# Patient Record
Sex: Male | Born: 1980 | Race: White | Hispanic: No | Marital: Single | Smoking: Never smoker
Health system: Southern US, Community
[De-identification: ages and names within clinical notes are randomized; demographics above are authoritative.]

## PROBLEM LIST (undated history)

## (undated) DIAGNOSIS — K219 Gastro-esophageal reflux disease without esophagitis: Secondary | ICD-10-CM

## (undated) DIAGNOSIS — B019 Varicella without complication: Secondary | ICD-10-CM

## (undated) HISTORY — DX: Varicella without complication: B01.9

## (undated) HISTORY — PX: UPPER GASTROINTESTINAL ENDOSCOPY: SHX188

## (undated) HISTORY — PX: OTHER SURGICAL HISTORY: SHX169

## (undated) HISTORY — DX: Gastro-esophageal reflux disease without esophagitis: K21.9

---

## 2012-12-12 ENCOUNTER — Ambulatory Visit (INDEPENDENT_AMBULATORY_CARE_PROVIDER_SITE_OTHER): Payer: BC Managed Care – PPO | Admitting: Family Medicine

## 2012-12-12 ENCOUNTER — Encounter: Payer: Self-pay | Admitting: Family Medicine

## 2012-12-12 VITALS — BP 118/76 | HR 75 | Temp 98.5°F | Ht 68.0 in | Wt 156.0 lb

## 2012-12-12 DIAGNOSIS — Z0000 Encounter for general adult medical examination without abnormal findings: Secondary | ICD-10-CM

## 2012-12-12 NOTE — Patient Instructions (Addendum)
Confirm date of last tetanus 

## 2012-12-12 NOTE — Progress Notes (Signed)
Subjective:    Patient ID: Roy King, male    DOB: 19-Apr-1981, 32 y.o.   MRN: [id number]  HPI Patient here to establish care. Generally very healthy. He has not seen a physician several years. He had recent screening blood work through work and this was all basically normal. This is reviewed today. He does not recall date of last tetanus but thinks this was in the past 5 years He takes no regular medications. No prior surgeries.  Social history is that he is single. He works as a Psychologist, occupational. Nonsmoker. Occasional alcohol use. Family history is significant for father with hyperlipidemia. No family history of premature CAD other than maternal uncle  Patient has had some issues of intermittent dysphagia about once or twice per month. Usually with solid foods such as meats. Sometimes takes 10 minutes or more for things to pass down. He's not any appetite or weight changes. He does not have any obvious consistent reflux-type symptoms. He does consume considerable amounts of caffeine area he has never had any odynophagia.  Past Medical History  Diagnosis Date  . Chicken pox    History reviewed. No pertinent past surgical history.  reports that he has never smoked. He does not have any smokeless tobacco history on file. He reports that  drinks alcohol. He reports that he does not use illicit drugs. family history includes Hearing loss in his mother; Heart disease in his paternal grandfather; Heart disease (age of onset: 41) in his maternal uncle; Hyperlipidemia in his father. No Known Allergies    Review of Systems  Constitutional: Negative for fever, activity change, appetite change, fatigue and unexpected weight change.  HENT: Positive for trouble swallowing. Negative for ear pain, congestion, sore throat and voice change.   Eyes: Negative for pain and visual disturbance.  Respiratory: Negative for cough, shortness of breath and wheezing.   Cardiovascular: Negative for chest pain and  palpitations.  Gastrointestinal: Negative for nausea, vomiting, abdominal pain, diarrhea, constipation, blood in stool, abdominal distention and rectal pain.  Genitourinary: Negative for dysuria, hematuria and testicular pain.  Musculoskeletal: Negative for joint swelling and arthralgias.  Skin: Negative for rash.  Neurological: Negative for dizziness, syncope and headaches.  Hematological: Negative for adenopathy.  Psychiatric/Behavioral: Negative for confusion and dysphoric mood.       Objective:   Physical Exam  Constitutional: He is oriented to person, place, and time. He appears well-developed and well-nourished. No distress.  HENT:  Head: Normocephalic and atraumatic.  Right Ear: External ear normal.  Left Ear: External ear normal.  Mouth/Throat: Oropharynx is clear and moist.  Eyes: Conjunctivae and EOM are normal. Pupils are equal, round, and reactive to light.  Neck: Normal range of motion. Neck supple. No thyromegaly present.  Cardiovascular: Normal rate, regular rhythm and normal heart sounds.   No murmur heard. Pulmonary/Chest: No respiratory distress. He has no wheezes. He has no rales.  Abdominal: Soft. Bowel sounds are normal. He exhibits no distension and no mass. There is no tenderness. There is no rebound and no guarding.  Musculoskeletal: He exhibits no edema.  Lymphadenopathy:    He has no cervical adenopathy.  Neurological: He is alert and oriented to person, place, and time. He displays normal reflexes. No cranial nerve deficit.  Skin: No rash noted.  Psychiatric: He has a normal mood and affect.          Assessment & Plan:  Complete physical. Labs reviewed and all unremarkable. Confirm date of last tetanus. GI referral given the  fact he's had some solid food dysphagia off and on for a few years as above. He does not have any red flags such as appetite or weight change.

## 2012-12-15 ENCOUNTER — Encounter: Payer: Self-pay | Admitting: Internal Medicine

## 2013-01-06 ENCOUNTER — Encounter: Payer: Self-pay | Admitting: Internal Medicine

## 2013-01-09 ENCOUNTER — Ambulatory Visit: Payer: BC Managed Care – PPO | Admitting: Internal Medicine

## 2013-02-02 ENCOUNTER — Encounter: Payer: Self-pay | Admitting: Internal Medicine

## 2013-02-05 ENCOUNTER — Encounter: Payer: Self-pay | Admitting: Internal Medicine

## 2013-02-05 ENCOUNTER — Ambulatory Visit (INDEPENDENT_AMBULATORY_CARE_PROVIDER_SITE_OTHER): Payer: BC Managed Care – PPO | Admitting: Internal Medicine

## 2013-02-05 VITALS — BP 98/64 | HR 64 | Ht 68.5 in | Wt 151.0 lb

## 2013-02-05 DIAGNOSIS — R1310 Dysphagia, unspecified: Secondary | ICD-10-CM

## 2013-02-05 DIAGNOSIS — R1314 Dysphagia, pharyngoesophageal phase: Secondary | ICD-10-CM

## 2013-02-05 DIAGNOSIS — R1319 Other dysphagia: Secondary | ICD-10-CM

## 2013-02-05 NOTE — Progress Notes (Signed)
Patient ID: Roy King, male   DOB: [birth year], 32 y.o.   MRN: [id number] HPI: Roy King is a 32 year old male without significant past medical history who is seen n consultation at the request of Dr. Caryl Never for evaluation of intermittent dysphagia. He is here alone today. He reports over the last 6 years having issues with swallowing. He reports pills always been hard for him to swallow. Otherwise he reports very intermittent solid food dysphagia. He reports he has learned to chew his food extremely well. Approximately once per month in random fashion he reports having a solid food bolus become stuck in his mid chest. This usually requires him to vomit the food bolus back up. During other times he has no trouble with solid foods and he reports liquids or never a problem. He thinks his swallowing issues worsen with stress. He does report occasional heartburn reports this is "not terrible". He reports heartburn maybe once per week. No nausea or vomiting. Appetite. No weight loss. He reports regular bowel movements with no blood in his stool or melena.  He does report occasionally bilateral lower abdominal cramping. He associates this with gas, and often is present when he's had to retain gas in settings not socially acceptable to release it.    No family hx of trouble swallowing, GI tract malignancy or IBD  Past Medical History  Diagnosis Date  . Chicken pox     History reviewed. No pertinent past surgical history.  No current outpatient prescriptions on file.   No current facility-administered medications for this visit.    No Known Allergies  Family History  Problem Relation Age of Onset  . Hearing loss Mother   . Hyperlipidemia Father   . Heart disease Paternal Grandfather   . Heart disease Paternal Uncle 12    mi  . Breast cancer Maternal Grandmother   . Breast cancer Maternal Aunt     History  Substance Use Topics  . Smoking status: Never Smoker   . Smokeless tobacco: Never Used   . Alcohol Use: Yes     Comment: .25 drinks per day; mostly on weekends    ROS: As per history of present illness, otherwise negative  BP 98/64  Pulse 64  Ht 5' 8.5" (1.74 m)  Wt 151 lb (68.493 kg)  BMI 22.62 kg/m2 Constitutional: Well-developed and well-nourished. No distress. HEENT: Normocephalic and atraumatic. Oropharynx is clear and moist. No oropharyngeal exudate. Conjunctivae are normal.  No scleral icterus. Neck: Neck supple. Trachea midline. Cardiovascular: Normal rate, regular rhythm and intact distal pulses. No M/R/G Pulmonary/chest: Effort normal and breath sounds normal. No wheezing, rales or rhonchi. Abdominal: Soft, nontender, nondistended. Bowel sounds active throughout. There are no masses palpable. No hepatosplenomegaly. Extremities: no clubbing, cyanosis, or edema Lymphadenopathy: No cervical adenopathy noted. Neurological: Alert and oriented to person place and time. Skin: Skin is warm and dry. No rashes noted. Psychiatric: Normal mood and affect. Behavior is normal.  ASSESSMENT/PLAN: 32 year old male without significant past medical history who is seen n consultation at the request of Dr. Caryl Never for evaluation of intermittent dysphagia.  1.  Esophageal dysphagia -- we discussed the differential which includes esophageal ring/stricture, eosinophilic esophagitis, or esophageal dysmotility. Reflux-induced esophagitis is felt unlikely given the randomness to his symptoms and lack of daily heartburn. We discussed direct visualization with endoscopy, but at this time he is not impacted enough by the symptoms to pursue an aggressive approach. We did discuss how endoscopy would most likely allow for diagnosis either by esophageal biopsy  or direct visualization of the stricture. A stricture is found dilation could be performed. After thorough discussion, we decided on pursuing barium swallow with tablet. This will rule out stricture and also help evaluate motility. Further  recommendations after barium swallow  2.  Heartburn -- his heartburn is infrequent I have recommended over-the-counter Zantac or Pepcid per box instructions as necessary.

## 2013-02-05 NOTE — Patient Instructions (Signed)
You have been scheduled for a Barium esophagram at [facility] (1st floor of the hospital) on 02/06/2013 at 9:30. Please arrive at 9:15 for registration. Make certain not to have anything to eat or drink 4 hours prior to the test. If you need to reschedule for any reason, please contact radiology @ [phone] to do so.   Please call us if your heartburn worsens.                                               We are excited to introduce MyChart, a new best-in-class service that provides you online access to important information in your electronic medical record. We want to make it easier for you to view your health information - all in one secure location - when and where you need it. We expect MyChart will enhance the quality of care and service we provide.  When you register for MyChart, you can:    View your test results.    Request appointments and receive appointment reminders via email.    Request medication renewals.    View your medical history, allergies, medications and immunizations.    Communicate with your physician's office through a password-protected site.    Conveniently print information such as your medication lists.  To find out if MyChart is right for you, please talk to a member of our clinical staff today. We will gladly answer your questions about this free health and wellness tool.  If you are age 31 or older and want a member of your family to have access to your record, you must provide written consent by completing a proxy form available at our office. Please speak to our clinical staff about guidelines regarding accounts for patients younger than age 24.  As you activate your MyChart account and need any technical assistance, please call the MyChart technical support line at [phone]) or email your question to [email or web address]. If you email your question(s), please include your name, a return phone number and the best  time to reach you.  If you have non-urgent health-related questions, you can send a message to our office through MyChart at [email or web address]. If you have a medical emergency, call 911.  Thank you for using MyChart as your new health and wellness resource!   MyChart licensed from [employer],  [id number]. Patents Pending.

## 2013-02-06 ENCOUNTER — Other Ambulatory Visit (HOSPITAL_COMMUNITY): Payer: BC Managed Care – PPO

## 2013-02-06 ENCOUNTER — Ambulatory Visit (HOSPITAL_COMMUNITY)
Admission: RE | Admit: 2013-02-06 | Discharge: 2013-02-06 | Disposition: A | Discharge status: Home / Self Care | Payer: BC Managed Care – PPO | Source: Ambulatory Visit | Attending: Internal Medicine | Admitting: Internal Medicine

## 2013-02-06 DIAGNOSIS — R1310 Dysphagia, unspecified: Secondary | ICD-10-CM | POA: Insufficient documentation

## 2013-03-02 ENCOUNTER — Other Ambulatory Visit: Payer: Self-pay | Admitting: Family

## 2013-03-02 ENCOUNTER — Ambulatory Visit (INDEPENDENT_AMBULATORY_CARE_PROVIDER_SITE_OTHER)
Admission: RE | Admit: 2013-03-02 | Discharge: 2013-03-02 | Disposition: A | Discharge status: Home / Self Care | Payer: BC Managed Care – PPO | Source: Ambulatory Visit | Attending: Family | Admitting: Family

## 2013-03-02 ENCOUNTER — Ambulatory Visit (INDEPENDENT_AMBULATORY_CARE_PROVIDER_SITE_OTHER): Payer: BC Managed Care – PPO | Admitting: Family

## 2013-03-02 ENCOUNTER — Encounter: Payer: Self-pay | Admitting: Family

## 2013-03-02 VITALS — BP 120/80 | Temp 98.1°F | Wt 151.0 lb

## 2013-03-02 DIAGNOSIS — M546 Pain in thoracic spine: Secondary | ICD-10-CM

## 2013-03-02 DIAGNOSIS — R1012 Left upper quadrant pain: Secondary | ICD-10-CM

## 2013-03-02 LAB — HEPATIC FUNCTION PANEL
ALT: 10 U/L (ref 0–53)
AST: 17 U/L (ref 0–37)
Albumin: 4.4 g/dL (ref 3.5–5.2)
Total Protein: 7 g/dL (ref 6.0–8.3)

## 2013-03-02 LAB — CBC WITH DIFFERENTIAL/PLATELET
Basophils Relative: 0.6 % (ref 0.0–3.0)
Eosinophils Absolute: 0.1 10*3/uL (ref 0.0–0.7)
Eosinophils Relative: 2.7 % (ref 0.0–5.0)
HCT: 41.3 % (ref 39.0–52.0)
Lymphs Abs: 1.3 10*3/uL (ref 0.7–4.0)
MCHC: 34.6 g/dL (ref 30.0–36.0)
MCV: 89.6 fl (ref 78.0–100.0)
Monocytes Absolute: 0.4 10*3/uL (ref 0.1–1.0)
RBC: 4.61 Mil/uL (ref 4.22–5.81)
WBC: 4.7 10*3/uL (ref 4.5–10.5)

## 2013-03-02 LAB — BASIC METABOLIC PANEL
BUN: 13 mg/dL (ref 6–23)
CO2: 30 mEq/L (ref 19–32)
Creatinine, Ser: 1 mg/dL (ref 0.4–1.5)
GFR: 92.55 mL/min (ref >60.00)
Glucose, Bld: 93 mg/dL (ref 70–99)
Potassium: 4 mEq/L (ref 3.5–5.1)
Sodium: 138 mEq/L (ref 135–145)

## 2013-03-02 MED ORDER — KETOROLAC TROMETHAMINE 60 MG/2ML IM SOLN
60.0000 mg | Freq: Once | INTRAMUSCULAR | Status: AC
  Administered 2013-03-02: 60 mg via INTRAMUSCULAR

## 2013-03-02 MED ORDER — HYDROCODONE-ACETAMINOPHEN 10-325 MG PO TABS: 1.0000 | ORAL_TABLET | Freq: Three times a day (TID) | ORAL | Status: DC | PRN

## 2013-03-02 MED ORDER — CYCLOBENZAPRINE HCL 10 MG PO TABS: 10.0000 mg | ORAL_TABLET | Freq: Three times a day (TID) | ORAL | Status: DC | PRN

## 2013-03-02 MED ORDER — OMEPRAZOLE 40 MG PO CPDR: 40.0000 mg | DELAYED_RELEASE_CAPSULE | Freq: Every day | ORAL | Status: DC

## 2013-03-02 MED ORDER — IOHEXOL 300 MG/ML  SOLN
100.0000 mL | Freq: Once | INTRAMUSCULAR | Status: AC | PRN
  Administered 2013-03-02: 100 mL via INTRAVENOUS

## 2013-03-02 NOTE — Progress Notes (Signed)
  Subjective:    Patient ID: Roy King, male    DOB: 1980-09-14, 32 y.o.   MRN: [id number]  HPI  32 year old white male, nonsmoker, patient of Dr. Caryl King is in today with an abrupt onset of back pain and left upper quadrant pain that began suddenly yesterday and has worsened over the last 24 hours. The pain is worse with movement. Better with sitting. Describes it as a stabbing pain in the left upper quadrant to the thoracic area of the back. Denies any heavy lifting, bending, twisting or turning. Denies any diarrhea or constipation. Reports consuming tube beers over the weekend. No heavy drinking. No excessive belching or burping. Pain is 8/10.   Review of Systems  Constitutional: Negative.   HENT: Negative.   Respiratory: Negative.   Cardiovascular: Negative.   Gastrointestinal: Positive for abdominal pain. Negative for nausea, diarrhea and constipation.  Genitourinary: Negative.   Musculoskeletal: Positive for back pain.  Skin: Negative.   Neurological: Negative.   Psychiatric/Behavioral: Negative.    Past Medical History  Diagnosis Date  . Chicken pox     History   Social History  . Marital Status: Single    Spouse Name: N/A    Number of Children: N/A  . Years of Education: N/A   Occupational History  . Not on file.   Social History Main Topics  . Smoking status: King Smoker   . Smokeless tobacco: King Used  . Alcohol Use: Yes     Comment: .25 drinks per day; mostly on weekends  . Drug Use: No  . Sexual Activity: Not on file   Other Topics Concern  . Not on file   Social History Narrative  . No narrative on file    History reviewed. No pertinent past surgical history.  Family History  Problem Relation Age of Onset  . Hearing loss Mother   . Hyperlipidemia Father   . Heart disease Paternal Grandfather   . Heart disease Paternal Uncle 75    mi  . Breast cancer Maternal Grandmother   . Breast cancer Maternal Aunt     No Known Allergies  No  current outpatient prescriptions on file prior to visit.   No current facility-administered medications on file prior to visit.    BP 120/80  Temp(Src) 98.1 F (36.7 C) (Oral)  Wt 151 lb (68.493 kg)chart    Objective:   Physical Exam  Constitutional: He is oriented to person, place, and time. He appears well-developed and well-nourished.  tearful  HENT:  Right Ear: External ear normal.  Left Ear: External ear normal.  Nose: Nose normal.  Mouth/Throat: Oropharynx is clear and moist.  Neck: Normal range of motion. Neck supple. No thyromegaly present.  Cardiovascular: Normal rate, regular rhythm and normal heart sounds.   Pulmonary/Chest: Effort normal and breath sounds normal.  Abdominal: Soft. Bowel sounds are normal. There is tenderness. There is rebound and guarding.  To the left upper quadrant  Musculoskeletal: Normal range of motion.  Neurological: He is alert and oriented to person, place, and time.  Skin: Skin is warm and dry.  Psychiatric: He has a normal mood and affect.          Assessment & Plan:  Assessment: 1. Left upper quadrant pain 2. Back pain  Plan: CT scan of the abdomen ordered to rule out pancreatitis. Amylase, lipase, CBC and BMP also sent. Will notify patient of the results and discuss further treatment plan.

## 2013-03-02 NOTE — Progress Notes (Signed)
Pre visit review using our clinic review tool, if applicable. No additional management support is needed unless otherwise documented below in the visit note. 

## 2013-03-02 NOTE — Patient Instructions (Addendum)
Abdominal Pain °Many things can cause belly (abdominal) pain. Most times, the belly pain is not dangerous. The amount of belly pain does not tell how serious the problem may be. Many cases of belly pain can be watched and treated at home. °HOME CARE  °· Do not take medicines that help you go poop (laxatives) unless told to by your doctor. °· Only take medicine as told by your doctor. °· Eat or drink as told by your doctor. Your doctor will tell you if you should be on a special diet. °GET HELP RIGHT AWAY IF:  °· The pain does not go away. °· You have a fever. °· You keep throwing up (vomiting). °· The pain changes and is only in the right or left part of the belly. °· You have bloody or tarry looking poop. °MAKE SURE YOU:  °· Understand these instructions. °· Will watch your condition. °· Will get help right away if you are not doing well or get worse. °Document Released: 09/26/2007 Document Revised: 07/02/2011 Document Reviewed: 04/25/2009 °ExitCare® Patient Information ©2014 [employer]. ° °

## 2013-03-04 ENCOUNTER — Telehealth: Payer: Self-pay | Admitting: Family Medicine

## 2013-03-04 NOTE — Telephone Encounter (Signed)
Patient calling into triage line regarding gastritis. Attempted to return call. No answer. Voicemail reached advised to call back for assistance. No triage. No contact.  Landers, Prajapati [phone]

## 2013-03-05 ENCOUNTER — Telehealth: Payer: Self-pay | Admitting: Family Medicine

## 2013-03-05 NOTE — Telephone Encounter (Signed)
[facility] Triage Call Report Triage Record Num: [id number] Operator: Roy King Patient Name: Roy King Call Date & Time: 03/04/2013 5:09:54PM Patient Phone: [phone] PCP: Evelena Peat Patient Gender: Male PCP Fax : [phone] Patient DOB: 1980/10/02 [facility] Name[facility] Reason for Call: Caller: [name]; PCP: Evelena Peat (Family Practice); CB#: [phone]; Call regarding ?? about Gastritis; Caller was seen in the office on Monday 03/02/13, diagnosed with Gastritis and has some questions. He reports he is improving, but still has some ongoing back pain. Caller given information from CAN Information Files. Causes of and home care for Gastritis given per Information Files. Caller asked what he was given by injection when he was seen in the office, EPIC record accessed. Advised he was given Toradol. All questions answered, caller advised to callback as needed and is agreeable. Protocol(s) Used: Information Only Call; No Symptom Triage (Adult) Recommended Outcome per Protocol: Provide Information or Advice Only Reason for Outcome: Health information question; person denies any symptoms, no triage required. Information provided from approved references or clinical experience. Care Advice: ~ 11/

## 2013-03-06 ENCOUNTER — Encounter: Payer: Self-pay | Admitting: Family

## 2013-03-06 ENCOUNTER — Telehealth: Payer: Self-pay | Admitting: Family Medicine

## 2013-03-06 ENCOUNTER — Ambulatory Visit (INDEPENDENT_AMBULATORY_CARE_PROVIDER_SITE_OTHER): Payer: BC Managed Care – PPO | Admitting: Family

## 2013-03-06 VITALS — BP 110/78 | HR 63 | Wt 151.0 lb

## 2013-03-06 DIAGNOSIS — IMO0001 Reserved for inherently not codable concepts without codable children: Secondary | ICD-10-CM

## 2013-03-06 DIAGNOSIS — K219 Gastro-esophageal reflux disease without esophagitis: Secondary | ICD-10-CM

## 2013-03-06 LAB — CBC WITH DIFFERENTIAL/PLATELET
Eosinophils Relative: 2.1 % (ref 0.0–5.0)
HCT: 41.5 % (ref 39.0–52.0)
Monocytes Relative: 7.1 % (ref 3.0–12.0)
Neutrophils Relative %: 58.6 % (ref 43.0–77.0)
Platelets: 168 10*3/uL (ref 150.0–400.0)
WBC: 5.1 10*3/uL (ref 4.5–10.5)

## 2013-03-06 LAB — BASIC METABOLIC PANEL
BUN: 10 mg/dL (ref 6–23)
CO2: 32 mEq/L (ref 19–32)
Calcium: 9.3 mg/dL (ref 8.4–10.5)
Chloride: 99 mEq/L (ref 96–112)
Creatinine, Ser: 0.9 mg/dL (ref 0.4–1.5)
Glucose, Bld: 67 mg/dL — ABNORMAL LOW (ref 70–99)

## 2013-03-06 MED ORDER — CYCLOBENZAPRINE HCL 10 MG PO TABS: 10.0000 mg | ORAL_TABLET | Freq: Three times a day (TID) | ORAL | Status: DC | PRN

## 2013-03-06 NOTE — Telephone Encounter (Signed)
Patient Information:  Caller Name: Elder  Phone: [phone]  Patient: [place], Bartosiewicz  Gender: Male  DOB: Jun 25, 1980  Age: 32 Years  PCP: Evelena Peat [facility])  Office Follow Up:  Does the office need to follow up with this patient?: No  Instructions For The Office: N/A   Symptoms  Reason For Call & Symptoms: Pt was in 03/02/13 with low back pain. He had a CT scan which r/o any pancreatitis/appendicitis. Pt was dx with gastritis. 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 Pt was given Vicodin and Prilosec. Pt is calling back to say the pain has extended into his hips and knees and is constant and 5/10.  Reviewed Health History In EMR: Yes  Reviewed Medications In EMR: Yes  Reviewed Allergies In EMR: Yes  Reviewed Surgeries / Procedures: Yes  Date of Onset of Symptoms: 03/02/2013  Guideline(s) Used:  Leg Pain  Back Pain  Disposition Per Guideline:   See Today in Office  Reason For Disposition Reached:   Pain radiates into the thigh or further down the leg, and in both legs  Advice Given:  N/A  Patient Will Follow Care Advice:  YES  Appointment Scheduled:  03/06/2013 13:15:00 Appointment Scheduled Provider:  Evelena Peat (Family Practice)

## 2013-03-06 NOTE — Progress Notes (Signed)
  Subjective:    Patient ID: Roy King, male    DOB: Feb 09, 1981, 32 y.o.   MRN: [id number]  HPI 32 year old white male, patient of Dr. Caryl Never is in today with complaints of low back pain and knee pain ongoing x4 days. He rates the pain a 4-5/10. Denies any injury. Describes it as achy.   and worse with movement. Patient was seen earlier this week for gastritis and taken omeprazole. His symptoms have improved significantly. Reports having increased stress in his life with new proposal to his girlfriend, planning a wedding, recently being in a wedding, and work stress. Has been taking Vicodin and that was given for abdominal pain that has helped..  Review of Systems  Constitutional: Negative.   Respiratory: Negative.   Cardiovascular: Negative.   Endocrine: Negative.   Genitourinary: Negative.   Musculoskeletal: Positive for arthralgias and back pain.       Knee pain   Skin: Negative.   Neurological: Negative.   Psychiatric/Behavioral: Negative.    Past Medical History  Diagnosis Date  . Chicken pox     History   Social History  . Marital Status: Single    Spouse Name: N/A    Number of Children: N/A  . Years of Education: N/A   Occupational History  . Not on file.   Social History Main Topics  . Smoking status: Never Smoker   . Smokeless tobacco: Never Used  . Alcohol Use: Yes     Comment: .25 drinks per day; mostly on weekends  . Drug Use: No  . Sexual Activity: Not on file   Other Topics Concern  . Not on file   Social History Narrative  . No narrative on file    History reviewed. No pertinent past surgical history.  Family History  Problem Relation Age of Onset  . Hearing loss Mother   . Hyperlipidemia Father   . Heart disease Paternal Grandfather   . Heart disease Paternal Uncle 25    mi  . Breast cancer Maternal Grandmother   . Breast cancer Maternal Aunt     No Known Allergies  Current Outpatient Prescriptions on File Prior to Visit   Medication Sig Dispense Refill  . omeprazole (PRILOSEC) 40 MG capsule Take 1 capsule (40 mg total) by mouth daily.  30 capsule  3   No current facility-administered medications on file prior to visit.    BP 110/78  Pulse 63  Wt 151 lb (68.493 kg)chart    Objective:   Physical Exam  Constitutional: He is oriented to person, place, and time. He appears well-developed and well-nourished.  Neck: Normal range of motion. Neck supple.  Cardiovascular: Normal rate, regular rhythm and normal heart sounds.   Pulmonary/Chest: Breath sounds normal.  Abdominal: Soft. Bowel sounds are normal.  Musculoskeletal: Normal range of motion. He exhibits no edema.  Negative SLR.   Neurological: He is alert and oriented to person, place, and time.  Skin: Skin is warm and dry.          Assessment & Plan:  Assessment: 1. Joint pain 2. Low back pain 3. GERD  Plan: Continue omeprazole. Flexeril 10 mg 3 times a day as needed for aches and pains. CBC, ESR, and BMP sent. Increase fluid intake, particularly water. Patient to call the office with any questions or concerns. Recheck pending labs and sooner if needed.

## 2013-03-06 NOTE — Patient Instructions (Signed)

## 2013-03-06 NOTE — Progress Notes (Signed)
Pre visit review using our clinic review tool, if applicable. No additional management support is needed unless otherwise documented below in the visit note. 

## 2013-07-23 ENCOUNTER — Other Ambulatory Visit: Payer: Self-pay | Admitting: Family

## 2013-12-12 ENCOUNTER — Other Ambulatory Visit: Payer: Self-pay | Admitting: Family Medicine

## 2013-12-18 ENCOUNTER — Telehealth: Payer: Self-pay | Admitting: Family Medicine

## 2013-12-18 ENCOUNTER — Encounter: Payer: Self-pay | Admitting: Family Medicine

## 2013-12-18 ENCOUNTER — Ambulatory Visit (INDEPENDENT_AMBULATORY_CARE_PROVIDER_SITE_OTHER): Payer: BC Managed Care – PPO | Admitting: Family Medicine

## 2013-12-18 VITALS — BP 126/78 | HR 66 | Temp 97.6°F | Wt 151.0 lb

## 2013-12-18 DIAGNOSIS — R109 Unspecified abdominal pain: Secondary | ICD-10-CM

## 2013-12-18 DIAGNOSIS — R112 Nausea with vomiting, unspecified: Secondary | ICD-10-CM

## 2013-12-18 MED ORDER — ONDANSETRON 8 MG PO TBDP: 8.0000 mg | ORAL_TABLET | Freq: Three times a day (TID) | ORAL | Status: DC | PRN

## 2013-12-18 NOTE — Progress Notes (Signed)
Pre visit review using our clinic review tool, if applicable. No additional management support is needed unless otherwise documented below in the visit note. 

## 2013-12-18 NOTE — Telephone Encounter (Signed)
Patient Information:  Caller Name: Waunita Schooner  Phone: [phone]  Patient: Roy King, Roy King  Gender: Male  DOB: October 23, 1980  Age: 33 Years  PCP: Other  Office Follow Up:  Does the office need to follow up with this patient?: No  Instructions For The Office: N/A   Symptoms  Reason For Call & Symptoms: Patient reports generalized abdominal pain that started within 15 minutes after he ate oysters on 12/17/13 (estimated 21:00).  Pain is constant, rated at 3-4 of 10, up to 6-7 with movement.  History of gastritis and he suspects same. Emergent symptoms ruled out.  Go to ED Now or to Office with PCP Approval.  Reviewed Health History In EMR: Yes  Reviewed Medications In EMR: Yes  Reviewed Allergies In EMR: Yes  Reviewed Surgeries / Procedures: Yes  Date of Onset of Symptoms: 12/17/2013  Treatments Tried: Tums, Prilosec  Treatments Tried Worked: No  Guideline(s) Used:  Abdominal Pain - Male  Disposition Per Guideline:   Go to ED Now (or to Office with PCP Approval)  Reason For Disposition Reached:   Constant abdominal pain lasting > 2 hours  Advice Given:  Rest:  Lie down and rest until you feel better.  Fluids:  Sip clear fluids only (e.g., water, flat soft drinks or half-strength fruit juice) until the pain has been gone for over 2 hours. Then slowly return to a regular diet.  Diet:  Slowly advance diet from clear liquids to a bland diet  Avoid alcohol or caffeinated beverages  Avoid greasy or fatty foods  Call Back If:  You become worse.  RN Overrode Recommendation:  Make Appointment  History of ingesting raw oysters 12/17/13; history of gastritis.  ash/can.  Appointment Scheduled:  12/18/2013 09:30:00 Appointment Scheduled Provider:  Carolann Littler [facility])

## 2013-12-18 NOTE — Patient Instructions (Signed)
Follow up for any fever, recurrent vomiting, or recurrent abdominal pain

## 2013-12-18 NOTE — Progress Notes (Signed)
   Subjective:    Patient ID: Roy King, male    DOB: 10/28/1980, 33 y.o.   MRN: [id number]  Abdominal Pain Associated symptoms include nausea and vomiting. Pertinent negatives include no diarrhea or fever.   Patient seen as a work in. Last night he 8 [employer] oysters and had a couple of beers. During the night he developed some diffuse crampy pain but mostly nausea with one episode of vomiting. No diarrhea. No fever. He feels much better today though still slightly queasy. He does not have any localizing abdominal pain. He took sometimes with minimal relief. He has history of reflux and takes Prilosec for that.  Past Medical History  Diagnosis Date  . Chicken pox    No past surgical history on file.  reports that he has never smoked. He has never used smokeless tobacco. He reports that he drinks alcohol. He reports that he does not use illicit drugs. family history includes Breast cancer in his maternal aunt and maternal grandmother; Hearing loss in his mother; Heart disease in his paternal grandfather; Heart disease (age of onset: 35) in his paternal uncle; Hyperlipidemia in his father. No Known Allergies    Review of Systems  Constitutional: Negative for fever and chills.  Cardiovascular: Negative for chest pain.  Gastrointestinal: Positive for nausea, vomiting and abdominal pain. Negative for diarrhea and blood in stool.       Objective:   Physical Exam  Constitutional: He appears well-developed and well-nourished.  Cardiovascular: Normal rate and regular rhythm.   Pulmonary/Chest: Effort normal and breath sounds normal. No respiratory distress. He has no wheezes. He has no rales.  Abdominal: Soft. Bowel sounds are normal. He exhibits no distension and no mass. There is no tenderness. There is no rebound and no guarding.          Assessment & Plan:  Nausea, vomiting, and abdominal cramping following ingestion of raw oysters. He does not have evidence for infectious issues  at this time. Zofran 8 mg every 8 hours as needed for nausea and vomiting. Followup promptly for any fever, recurrent vomiting, or recurrent abdominal pain.

## 2014-01-14 ENCOUNTER — Other Ambulatory Visit: Payer: Self-pay | Admitting: Family Medicine

## 2014-08-08 ENCOUNTER — Other Ambulatory Visit: Payer: Self-pay | Admitting: Family Medicine

## 2015-02-08 ENCOUNTER — Other Ambulatory Visit: Payer: Self-pay | Admitting: Family Medicine

## 2015-03-28 ENCOUNTER — Other Ambulatory Visit (INDEPENDENT_AMBULATORY_CARE_PROVIDER_SITE_OTHER): Payer: BLUE CROSS/BLUE SHIELD

## 2015-03-28 DIAGNOSIS — Z0000 Encounter for general adult medical examination without abnormal findings: Secondary | ICD-10-CM | POA: Diagnosis not present

## 2015-03-28 LAB — CBC WITH DIFFERENTIAL/PLATELET
BASOS PCT: 0.8 % (ref 0.0–3.0)
Basophils Absolute: 0 10*3/uL (ref 0.0–0.1)
EOS PCT: 6.8 % — AB (ref 0.0–5.0)
Eosinophils Absolute: 0.4 10*3/uL (ref 0.0–0.7)
HEMATOCRIT: 42.6 % (ref 39.0–52.0)
HEMOGLOBIN: 14.4 g/dL (ref 13.0–17.0)
LYMPHS PCT: 30.5 % (ref 12.0–46.0)
Lymphs Abs: 1.7 10*3/uL (ref 0.7–4.0)
MCHC: 33.8 g/dL (ref 30.0–36.0)
MCV: 89.2 fl (ref 78.0–100.0)
MONOS PCT: 9.4 % (ref 3.0–12.0)
Monocytes Absolute: 0.5 10*3/uL (ref 0.1–1.0)
Neutro Abs: 2.9 10*3/uL (ref 1.4–7.7)
Neutrophils Relative %: 52.5 % (ref 43.0–77.0)
Platelets: 157 10*3/uL (ref 150.0–400.0)
RBC: 4.77 Mil/uL (ref 4.22–5.81)
RDW: 12.7 % (ref 11.5–15.5)
WBC: 5.4 10*3/uL (ref 4.0–10.5)

## 2015-03-28 LAB — BASIC METABOLIC PANEL
BUN: 14 mg/dL (ref 6–23)
CHLORIDE: 101 meq/L (ref 96–112)
CO2: 30 meq/L (ref 19–32)
Calcium: 9.4 mg/dL (ref 8.4–10.5)
Creatinine, Ser: 0.87 mg/dL (ref 0.40–1.50)
GFR: 107.27 mL/min (ref >60.00)
Glucose, Bld: 81 mg/dL (ref 70–99)
POTASSIUM: 4 meq/L (ref 3.5–5.1)
Sodium: 139 mEq/L (ref 135–145)

## 2015-03-28 LAB — TSH: TSH: 2.72 u[IU]/mL (ref 0.35–4.50)

## 2015-03-28 LAB — HEPATIC FUNCTION PANEL
ALBUMIN: 4.6 g/dL (ref 3.5–5.2)
ALK PHOS: 49 U/L (ref 39–117)
ALT: 10 U/L (ref 0–53)
AST: 14 U/L (ref 0–37)
Bilirubin, Direct: 0.1 mg/dL (ref 0.0–0.3)
TOTAL PROTEIN: 7 g/dL (ref 6.0–8.3)
Total Bilirubin: 0.5 mg/dL (ref 0.2–1.2)

## 2015-03-28 LAB — LIPID PANEL
CHOL/HDL RATIO: 4
Cholesterol: 173 mg/dL (ref 0–200)
HDL: 49.4 mg/dL (ref >39.00)
LDL Cholesterol: 93 mg/dL (ref 0–99)
NONHDL: 123.97
Triglycerides: 153 mg/dL — ABNORMAL HIGH (ref 0.0–149.0)
VLDL: 30.6 mg/dL (ref 0.0–40.0)

## 2015-04-05 ENCOUNTER — Ambulatory Visit (INDEPENDENT_AMBULATORY_CARE_PROVIDER_SITE_OTHER): Payer: BLUE CROSS/BLUE SHIELD | Admitting: Family Medicine

## 2015-04-05 ENCOUNTER — Encounter: Payer: Self-pay | Admitting: Family Medicine

## 2015-04-05 VITALS — BP 100/80 | HR 69 | Temp 98.0°F | Resp 14 | Ht 68.5 in | Wt 140.3 lb

## 2015-04-05 DIAGNOSIS — Z23 Encounter for immunization: Secondary | ICD-10-CM

## 2015-04-05 DIAGNOSIS — Z0000 Encounter for general adult medical examination without abnormal findings: Secondary | ICD-10-CM | POA: Diagnosis not present

## 2015-04-05 NOTE — Progress Notes (Signed)
Pre visit review using our clinic review tool, if applicable. No additional management support is needed unless otherwise documented below in the visit note. 

## 2015-04-05 NOTE — Patient Instructions (Signed)

## 2015-04-05 NOTE — Progress Notes (Signed)
   Subjective:    Patient ID: Roy King, male    DOB: 13-May-1980, 34 y.o.   MRN: [id number]  HPI Here for physical exam. Generally very healthy. He's had past history of reflux and takes Prilosec. No recent dysphagia. Nonsmoker. Exercises but inconsistently. Has lost some weight during the past year. Good appetite. No concerning symptoms otherwise. Frequently skips breakfast. Last tetanus unknown. Had flu vaccine through work.  Past Medical History  Diagnosis Date  . Chicken pox    No past surgical history on file.  reports that he has never smoked. He has never used smokeless tobacco. He reports that he drinks alcohol. He reports that he does not use illicit drugs. family history includes Breast cancer in his maternal aunt and maternal grandmother; Hearing loss in his mother; Heart disease in his paternal grandfather; Heart disease (age of onset: 30) in his paternal uncle; Hyperlipidemia in his father. No Known Allergies    Review of Systems  Constitutional: Negative for fever, activity change, appetite change and fatigue.  HENT: Negative for congestion, ear pain and trouble swallowing.   Eyes: Negative for pain and visual disturbance.  Respiratory: Negative for cough, shortness of breath and wheezing.   Cardiovascular: Negative for chest pain and palpitations.  Gastrointestinal: Negative for nausea, vomiting, abdominal pain, diarrhea, constipation, blood in stool, abdominal distention and rectal pain.  Genitourinary: Negative for dysuria, hematuria and testicular pain.  Musculoskeletal: Negative for joint swelling and arthralgias.  Skin: Negative for rash.  Neurological: Negative for dizziness, syncope and headaches.  Hematological: Negative for adenopathy.  Psychiatric/Behavioral: Negative for confusion and dysphoric mood.       Objective:   Physical Exam  Constitutional: He is oriented to person, place, and time. He appears well-developed and well-nourished. No  distress.  HENT:  Head: Normocephalic and atraumatic.  Right Ear: External ear normal.  Left Ear: External ear normal.  Mouth/Throat: Oropharynx is clear and moist.  Eyes: Conjunctivae and EOM are normal. Pupils are equal, round, and reactive to light.  Neck: Normal range of motion. Neck supple. No thyromegaly present.  Cardiovascular: Normal rate, regular rhythm and normal heart sounds.   No murmur heard. Pulmonary/Chest: No respiratory distress. He has no wheezes. He has no rales.  Abdominal: Soft. Bowel sounds are normal. He exhibits no distension and no mass. There is no tenderness. There is no rebound and no guarding.  Musculoskeletal: He exhibits no edema.  Lymphadenopathy:    He has no cervical adenopathy.  Neurological: He is alert and oriented to person, place, and time. He displays normal reflexes. No cranial nerve deficit.  Skin: No rash noted.  Psychiatric: He has a normal mood and affect.          Assessment & Plan:  Physical exam. Labs reviewed. No major concerns. Tetanus booster given. Continue yearly flu vaccine. We discussed dietary factors and recommended avoiding skipping meals. Discussed healthy ways to add lean mass

## 2015-06-06 IMAGING — RF DG ESOPHAGUS
13 of 20 series · 15 of 24 positions shown · non-contrast
Comparison: None.

FLUOROSCOPY TIME:  0 Min is 57 seconds.

CLINICAL DATA: Dysphagia, intermittent for 2 years.

EXAM:
ESOPHOGRAM / BARIUM SWALLOW / BARIUM TABLET STUDY
TECHNIQUE: Combined double contrast and single contrast examination performed
using effervescent crystals, thick barium liquid, and thin barium
liquid. The patient was observed with fluoroscopy swallowing a 13mm
barium sulphate tablet.

[Series 1: run · 2 of 6 slices shown (1 of 13)]
[im 1/6]
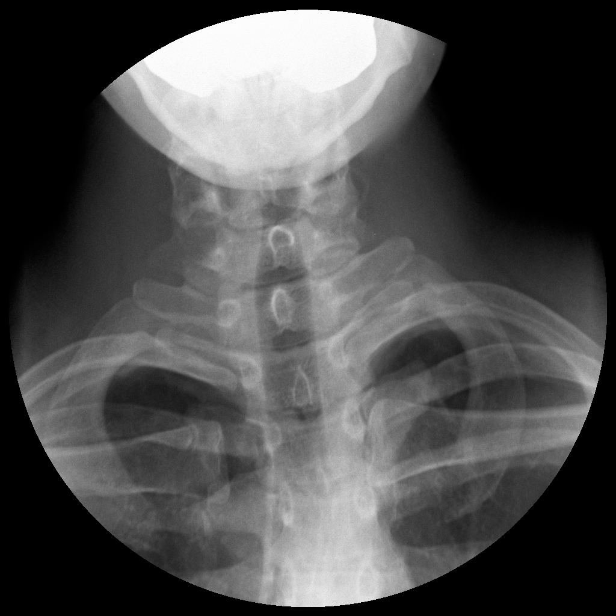
[im 6/6]
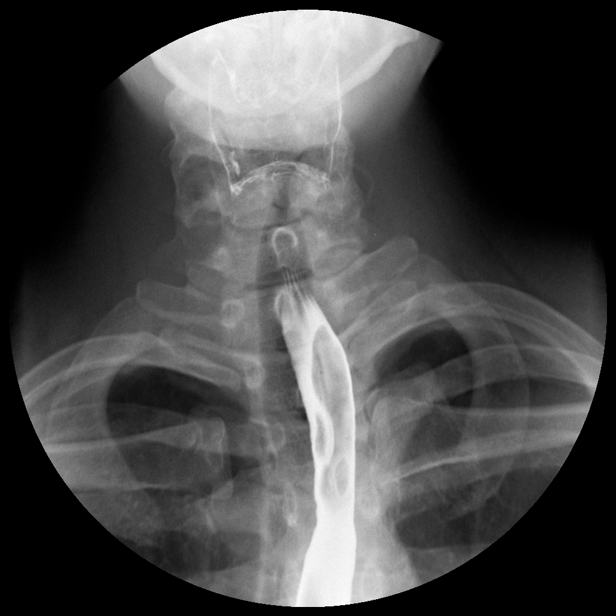

[Series 3: run · 2 of 5 slices shown (2 of 13)]
[im 1/5]
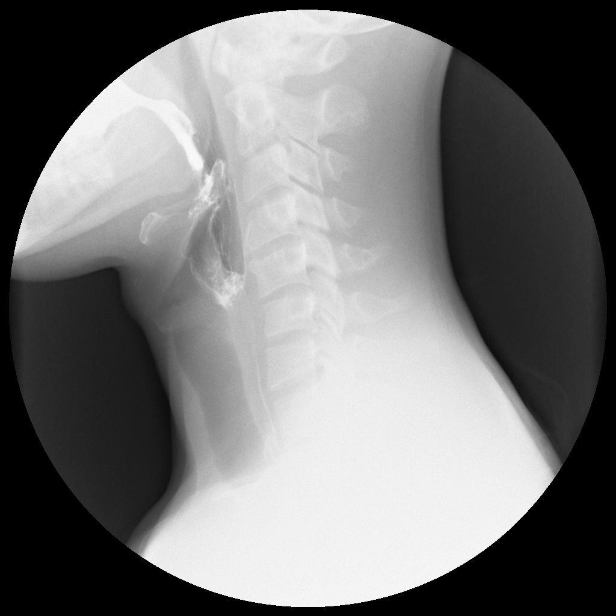
[im 3/5]
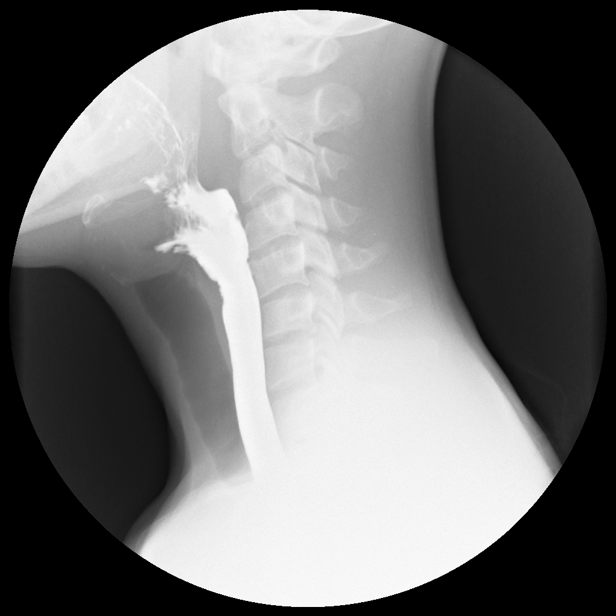

[Series 4: run · 1 of 1 slices shown (3 of 13)]
[im 1/1]
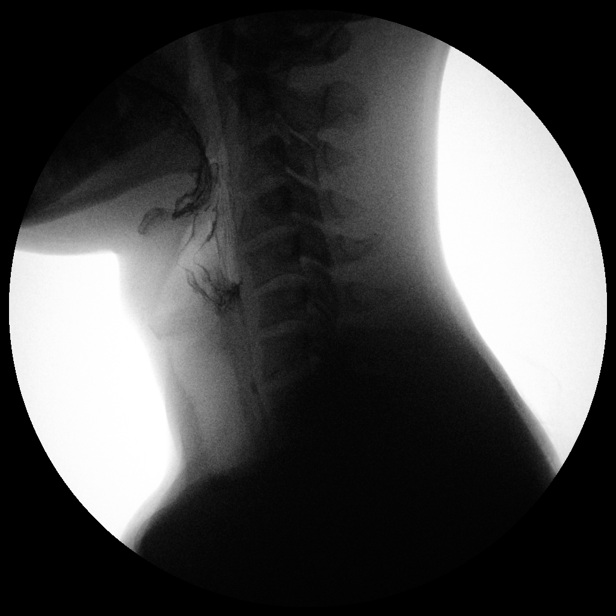

[Series 5: run · 1 of 1 slices shown (4 of 13)]
[im 1/1]
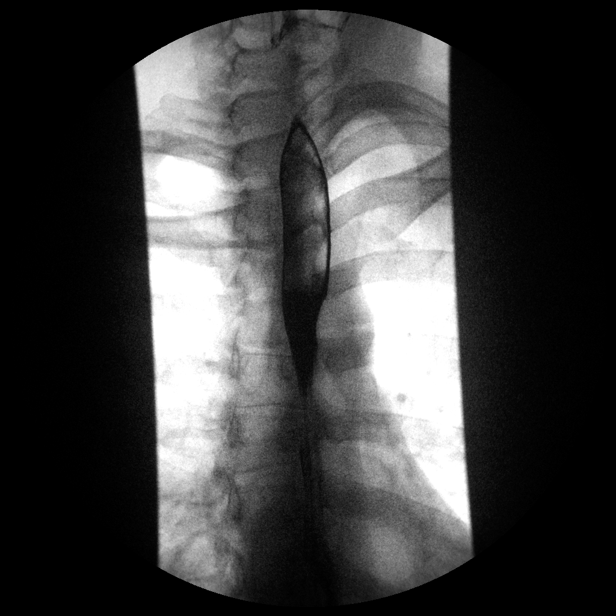

[Series 7: run · 1 of 1 slices shown (5 of 13)]
[im 1/1]
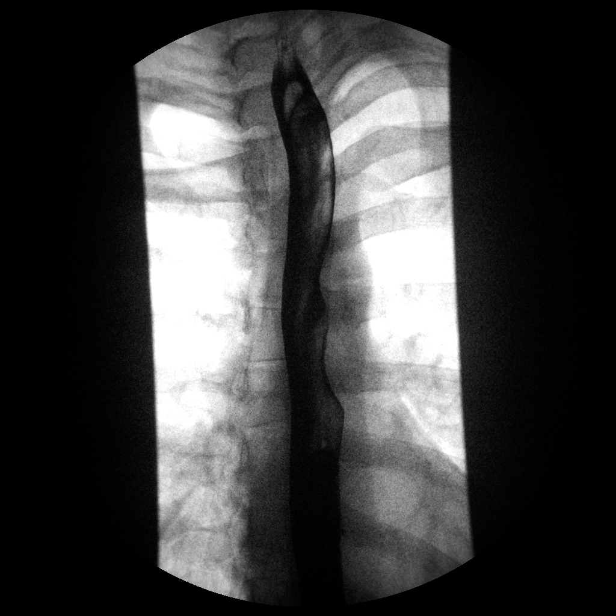

[Series 9: run · 1 of 1 slices shown (6 of 13)]
[im 1/1]
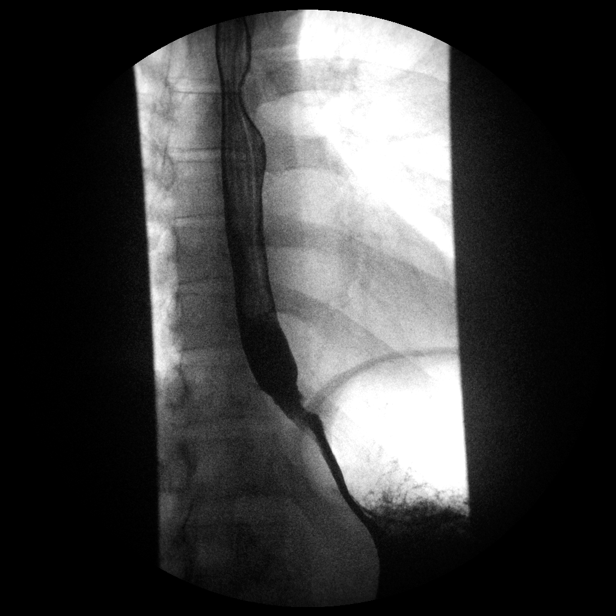

[Series 10: run · 1 of 1 slices shown (7 of 13)]
[im 1/1]
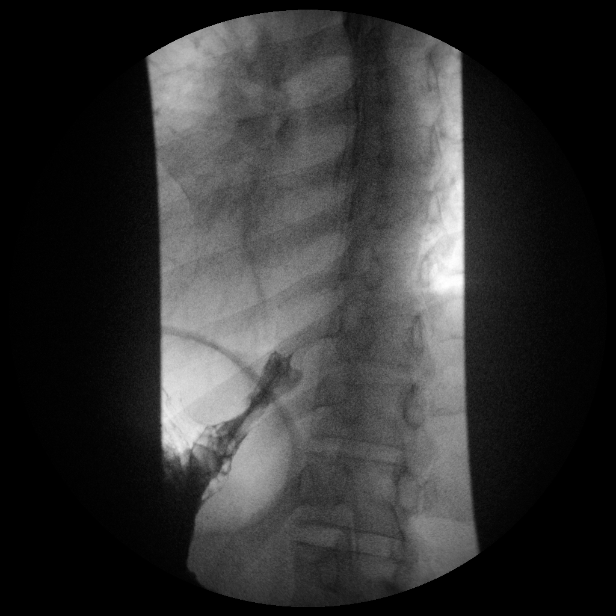

[Series 12: run · 1 of 1 slices shown (8 of 13)]
[im 1/1]
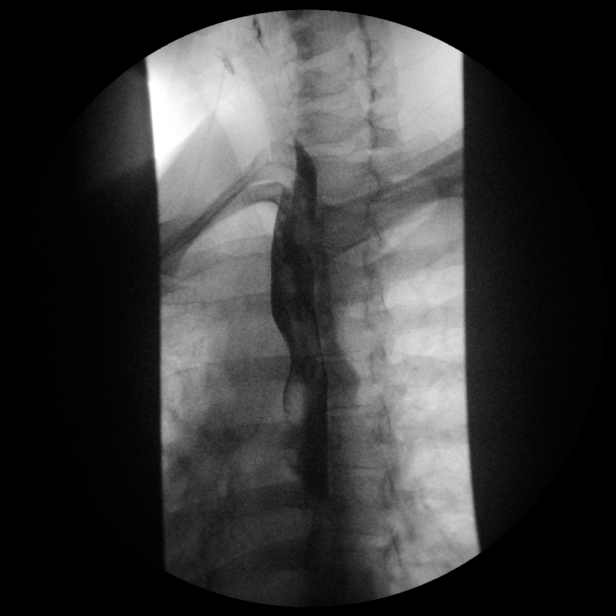

[Series 13: run · 1 of 1 slices shown (9 of 13)]
[im 1/1]
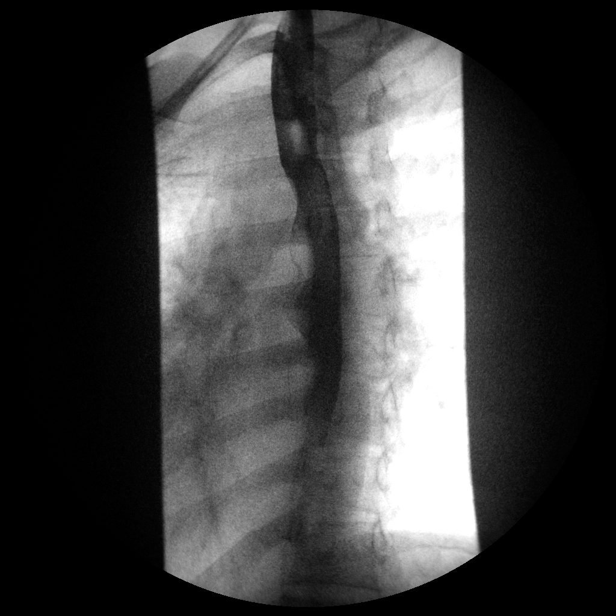

[Series 15: run · 1 of 1 slices shown (10 of 13)]
[im 1/1]
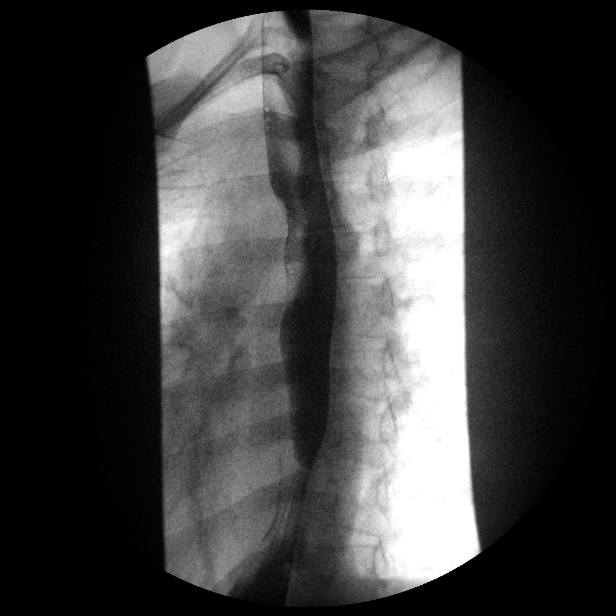

[Series 17: run · 1 of 1 slices shown (11 of 13)]
[im 1/1]
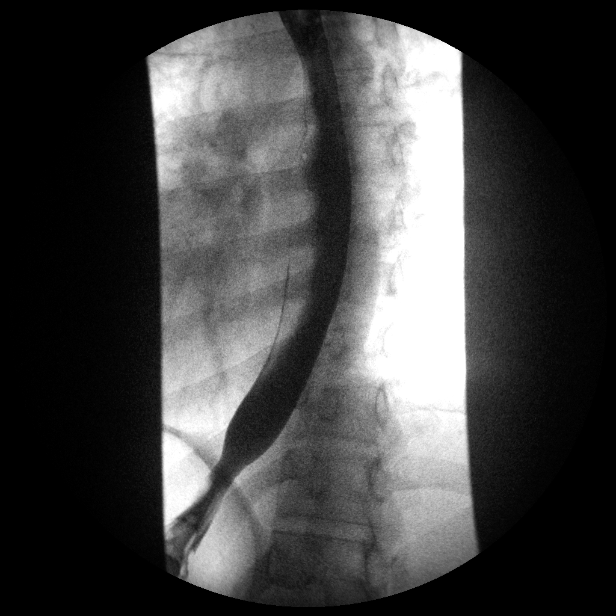

[Series 18: run · 1 of 1 slices shown (12 of 13)]
[im 1/1]
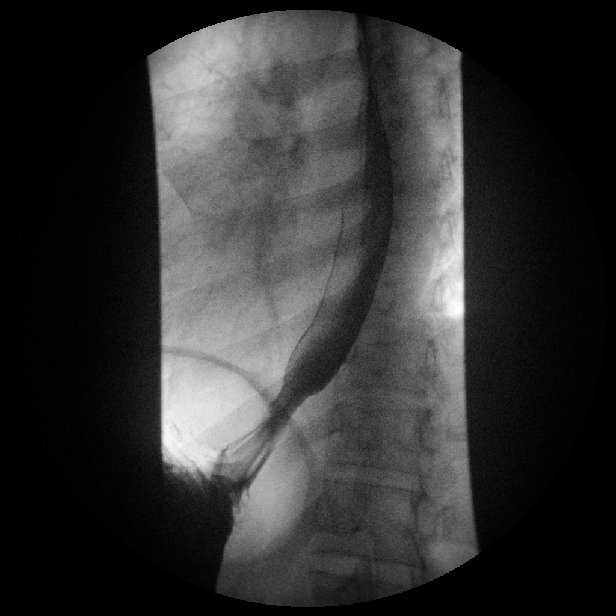

[Series 20: run · 1 of 1 slices shown (13 of 13)]
[im 1/1]
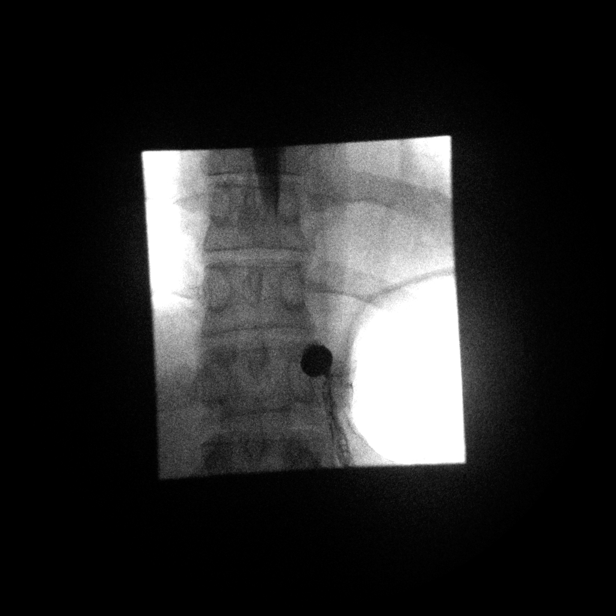

[15 of 24 positions shown; findings below may reference images not displayed]

FINDINGS: Swallowing mechanism is unremarkable. Esophageal motility is normal.
No esophageal fold thickening. Gastroesophageal junction appears
slightly narrowed. A 13 mm barium pill would not pass into the
stomach.
IMPRESSION: Slight narrowing at the gastroesophageal junction, through which a
13 mm barium pill would not pass.

## 2015-09-19 ENCOUNTER — Other Ambulatory Visit: Payer: Self-pay | Admitting: Family Medicine

## 2015-09-28 ENCOUNTER — Other Ambulatory Visit: Payer: Self-pay | Admitting: *Deleted

## 2015-09-28 MED ORDER — OMEPRAZOLE 40 MG PO CPDR: 40.0000 mg | DELAYED_RELEASE_CAPSULE | Freq: Every day | ORAL | Status: DC

## 2015-10-18 ENCOUNTER — Encounter: Payer: Self-pay | Admitting: Family Medicine

## 2015-10-18 ENCOUNTER — Ambulatory Visit (INDEPENDENT_AMBULATORY_CARE_PROVIDER_SITE_OTHER): Payer: BLUE CROSS/BLUE SHIELD | Admitting: Family Medicine

## 2015-10-18 VITALS — BP 90/60 | HR 59 | Temp 98.0°F | Ht 68.5 in | Wt 147.7 lb

## 2015-10-18 DIAGNOSIS — Z139 Encounter for screening, unspecified: Secondary | ICD-10-CM | POA: Diagnosis not present

## 2015-10-18 NOTE — Progress Notes (Signed)
   Subjective:    Patient ID: Roy King, male    DOB: 06-24-1980, 35 y.o.   MRN: [id number]  HPI Patient here to discuss possible lab testing. Just recently discovered his wife is approximately 2 months pregnant. Around the time she would've gotten pregnant they were vacationing down in [place] around [place] and [place] region. There concerned about possible exposure to Zika virus Neither of them was aware of any obvious mosquito bites and had not had any symptoms whatsoever.  His wife saw gynecologist earlier today and they apparently did serology testing (for Zika virus). They were in [place] from May 3 to May 6. Patient is totally asymptomatic. No chronic medical problems. No hx of Yellow Fever Vaccine or Dengue Fever.  Past Medical History  Diagnosis Date  . Chicken pox    No past surgical history on file.  reports that he has never smoked. He has never used smokeless tobacco. He reports that he drinks alcohol. He reports that he does not use illicit drugs. family history includes Breast cancer in his maternal aunt and maternal grandmother; Hearing loss in his mother; Heart disease in his paternal grandfather; Heart disease (age of onset: 72) in his paternal uncle; Hyperlipidemia in his father. No Known Allergies    Review of Systems  Constitutional: Negative for fever and chills.  Musculoskeletal: Negative for myalgias.  Skin: Negative for rash.  Neurological: Negative for headaches.       Objective:   Physical Exam  Constitutional: He appears well-developed and well-nourished.  Neck: Neck supple. No thyromegaly present.  Cardiovascular: Normal rate and regular rhythm.   No murmur heard. Pulmonary/Chest: Effort normal and breath sounds normal. No respiratory distress. He has no wheezes. He has no rales.  Lymphadenopathy:    He has no cervical adenopathy.          Assessment & Plan:  Requesting test for Zika virus.  This sounds like a relatively low risk  situation-though they were briefly in an area known for Zika virus. Neither he nor his wife had any symptoms though we reviewed that people that have this virus are not necessarily symptomatic. We discussed many issues of testing including potential for false positive. We also explained that there is no specific therapy even if individuals have this virus. He understands and because his wife was tested earlier today he would like to have peace of mind with testing. Given all the potential limitations of testing including potential false positives and negatives, patient wishes to proceed with serologic testing.  Eulas Post MD [facility] at [facility]

## 2015-10-18 NOTE — Progress Notes (Signed)
Pre visit review using our clinic review tool, if applicable. No additional management support is needed unless otherwise documented below in the visit note. 

## 2015-10-21 LAB — ZIKA VIRUS RNA, QUAL RT-PCR PNL,S/U

## 2015-10-27 NOTE — Addendum Note (Signed)
Addended by: Gari Crown D on: 10/27/2015 04:09 PM   Modules accepted: Orders

## 2015-10-28 NOTE — Addendum Note (Signed)
Addended by: Gari Crown D on: 10/28/2015 12:51 PM   Modules accepted: Orders

## 2016-02-10 DIAGNOSIS — Z23 Encounter for immunization: Secondary | ICD-10-CM | POA: Diagnosis not present

## 2016-03-06 ENCOUNTER — Ambulatory Visit (INDEPENDENT_AMBULATORY_CARE_PROVIDER_SITE_OTHER): Payer: BLUE CROSS/BLUE SHIELD | Admitting: Adult Health

## 2016-03-06 ENCOUNTER — Encounter: Payer: Self-pay | Admitting: Adult Health

## 2016-03-06 VITALS — BP 106/80 | Temp 98.6°F | Ht 68.5 in | Wt 152.0 lb

## 2016-03-06 DIAGNOSIS — M545 Low back pain, unspecified: Secondary | ICD-10-CM

## 2016-03-06 MED ORDER — CYCLOBENZAPRINE HCL 10 MG PO TABS: 10.0000 mg | ORAL_TABLET | Freq: Three times a day (TID) | ORAL | 0 refills | Status: DC | PRN

## 2016-03-06 NOTE — Patient Instructions (Signed)
It was great meeting you today   Your exam is consistent with a muscle strain. I do not think this is gastritis. I have sent in a prescription for a muscle relaxer called Flexeril. Take this tonight to see how if makes you feel.   You can also take Motrin 600mg  every 8 hours as needed for pain

## 2016-03-06 NOTE — Progress Notes (Signed)
   Subjective:    Patient ID: Roy King, male    DOB: Feb 27, 1981, 35 y.o.   MRN: [id number]  HPI 35 year old male, patient of Dr. Elease Hashimoto who presents to the office today for the complaint of " gastritis". He has been taking Prilosec for multiple years and reports " it always worked well in the past." He had some spicy [place] food over the weekend and when he woke up this morning he had pain in his right mid back. He reports that he feels as though this his how his GERD was diagnosed previously. He denies any trauma to the area.   He reports no nausea, vomiting or diarrhea or abdominal pain. Has no issues with urination.   He endorses having pain with movements such as bending and twisting.    Review of Systems  Constitutional: Positive for activity change. Negative for appetite change, chills, diaphoresis, fatigue, fever and unexpected weight change.  Respiratory: Negative.   Cardiovascular: Negative.   Gastrointestinal: Negative.   Genitourinary: Negative.   Musculoskeletal: Positive for back pain and myalgias.  Skin: Negative.   All other systems reviewed and are negative.      Objective:   Physical Exam  Constitutional: He is oriented to person, place, and time. He appears well-developed and well-nourished. No distress.  Cardiovascular: Normal rate, regular rhythm, normal heart sounds and intact distal pulses.  Exam reveals no gallop and no friction rub.   No murmur heard. Pulmonary/Chest: Effort normal and breath sounds normal. No respiratory distress. He has no wheezes. He has no rales. He exhibits no tenderness.  Abdominal: Soft. Bowel sounds are normal. There is no hepatosplenomegaly, splenomegaly or hepatomegaly. There is no tenderness. There is no CVA tenderness.  Musculoskeletal: Normal range of motion. He exhibits tenderness. He exhibits no edema or deformity.  Tenderness with palpation to right middle back. He has discomfort when laying down and ROM exercises    Neurological: He is alert and oriented to person, place, and time.  Skin: Skin is warm and dry. No rash noted. He is not diaphoretic. No erythema. No pallor.  Psychiatric: He has a normal mood and affect. His behavior is normal. Judgment and thought content normal.  Nursing note and vitals reviewed.     Assessment & Plan:  1. Lumbosacral pain - Does not present as GERD. Appears more muscular in nature. Possible strain or costochondritis.  - cyclobenzaprine (FLEXERIL) 10 MG tablet; Take 1 tablet (10 mg total) by mouth 3 (three) times daily as needed for muscle spasms.  Dispense: 30 tablet; Refill: 0 - Motrin 600mg   - Heating pad - Follow up if no improvement  Dorothyann Peng, NP

## 2016-04-25 ENCOUNTER — Telehealth: Payer: Self-pay | Admitting: Family Medicine

## 2016-04-25 NOTE — Telephone Encounter (Signed)
Spoke with Dr Elease Hashimoto and he does not recommend getting the Tdap early unless there has been an injury that would pose risk of tetanus. Spoke with pt and advised. He is still not sure if he wants to get vaccine or not. He is very concerned about exposure risk to the baby. Advised pt to contact office if he decides to get vaccine. Nothing further needed at this time.

## 2016-04-25 NOTE — Telephone Encounter (Signed)
Patient Name: Roy King  DOB: 02-12-1981    Initial Comment Caller states has questions T-Dap shot.    Nurse Assessment  Nurse: Raphael Gibney, RN, Vanita Ingles Date/Time (Eastern Time): 04/25/2016 11:02:54 AM  Confirm and document reason for call. If symptomatic, describe symptoms. ---Caller states he had Tdap vaccine in 2016. His wife is pregnant and due in Feb 2018. She is getting a Tdap and he is wanting to know he needs one too.  Does the patient have any new or worsening symptoms? ---No  Please document clinical information provided and list any resource used. ---Advised caller as per http://www.kim.net/ - Tdap is especially important for health care professionals and anyone having close contact with a baby younger than 12 months. Verbalized understanding.     Guidelines    Guideline Title Affirmed Question Affirmed Notes       Final Disposition User   Clinical Call Lyon, RN, Vanita Ingles

## 2016-04-28 ENCOUNTER — Other Ambulatory Visit: Payer: Self-pay | Admitting: Family Medicine

## 2016-05-01 DIAGNOSIS — Z23 Encounter for immunization: Secondary | ICD-10-CM | POA: Diagnosis not present

## 2016-06-27 DIAGNOSIS — K2950 Unspecified chronic gastritis without bleeding: Secondary | ICD-10-CM | POA: Diagnosis not present

## 2017-02-01 DIAGNOSIS — Z23 Encounter for immunization: Secondary | ICD-10-CM | POA: Diagnosis not present

## 2017-06-14 DIAGNOSIS — Z0000 Encounter for general adult medical examination without abnormal findings: Secondary | ICD-10-CM | POA: Diagnosis not present

## 2017-11-29 DIAGNOSIS — J019 Acute sinusitis, unspecified: Secondary | ICD-10-CM | POA: Diagnosis not present

## 2018-01-08 ENCOUNTER — Ambulatory Visit (INDEPENDENT_AMBULATORY_CARE_PROVIDER_SITE_OTHER): Payer: BLUE CROSS/BLUE SHIELD | Admitting: Family Medicine

## 2018-01-08 ENCOUNTER — Encounter: Payer: Self-pay | Admitting: Family Medicine

## 2018-01-08 ENCOUNTER — Other Ambulatory Visit: Payer: Self-pay

## 2018-01-08 VITALS — BP 102/62 | HR 78 | Temp 97.9°F | Resp 15 | Ht 65.75 in | Wt 144.9 lb

## 2018-01-08 DIAGNOSIS — Z0000 Encounter for general adult medical examination without abnormal findings: Secondary | ICD-10-CM

## 2018-01-08 NOTE — Patient Instructions (Signed)
Try OTC Flonase 1-2 sprays per nostril once daily  Let me know if headaches not improving in two to three weeks.

## 2018-01-08 NOTE — Progress Notes (Signed)
Subjective:     Patient ID: Roy King, male   DOB: 30-Jun-1980, 37 y.o.   MRN: [id number]  HPI  Here today for physical exam.  was seen several years ago and had some mild dysphagia. 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 He took some Prilosec and symptoms have been relatively stable since that time. Never had EGD. Still has occasional symptoms of globus but no consistent dysphagia symptoms.  Generally very healthy. He had lab work through work recently which was all unremarkable except for minimally elevated lipids. No family history of premature CAD in any first-degree relatives.  He's had some headaches over the past month which are somewhat low grade and bitemporal. He thinks this may be stress related. He does not have associated throbbing, nausea, vomiting, photosensitivity, or exertional component. He'll occasionally take Advil which helps. Drink some caffeine but trying to minimize.  He does have some daily nasal congestion but no purulent secretions. No fevers or chills.  He has a 27-month-old son. 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   Nonsmoker.  Past Medical History:  Diagnosis Date  . Chicken pox    History reviewed. No pertinent surgical history.  reports that he has never smoked. He has never used smokeless tobacco. He reports that he drinks alcohol. He reports that he does not use drugs. family history includes Breast cancer in his maternal aunt and maternal grandmother; Hearing loss in his mother; Heart disease in his paternal grandfather; Heart disease (age of onset: 20) in his paternal uncle; Hyperlipidemia in his father. No Known Allergies  Review of Systems  Constitutional: Negative for activity change, appetite change, fatigue and fever.  HENT: Positive for congestion. Negative for ear pain and sore throat.   Eyes: Negative for pain and visual disturbance.  Respiratory: Negative for cough, shortness of breath and wheezing.    Cardiovascular: Negative for chest pain and palpitations.  Gastrointestinal: Negative for abdominal distention, abdominal pain, blood in stool, constipation, diarrhea, nausea, rectal pain and vomiting.  Genitourinary: Negative for dysuria, hematuria and testicular pain.  Musculoskeletal: Negative for arthralgias and joint swelling.  Skin: Negative for rash.  Neurological: Positive for headaches. Negative for dizziness and syncope.  Hematological: Negative for adenopathy.  Psychiatric/Behavioral: Negative for confusion and dysphoric mood.       Objective:   Physical Exam  Constitutional: He is oriented to person, place, and time. He appears well-developed and well-nourished. No distress.  HENT:  Head: Normocephalic and atraumatic.  Right Ear: External ear normal.  Left Ear: External ear normal.  Mouth/Throat: Oropharynx is clear and moist.  Eyes: Pupils are equal, round, and reactive to light. Conjunctivae and EOM are normal.  Neck: Normal range of motion. Neck supple. No thyromegaly present.  Cardiovascular: Normal rate, regular rhythm and normal heart sounds.  No murmur heard. Pulmonary/Chest: No respiratory distress. He has no wheezes. He has no rales.  Abdominal: Soft. Bowel sounds are normal. He exhibits no distension and no mass. There is no tenderness. There is no rebound and no guarding.  Musculoskeletal: He exhibits no edema.  Lymphadenopathy:    He has no cervical adenopathy.  Neurological: He is alert and oriented to person, place, and time. He displays normal reflexes. No cranial nerve deficit.  Skin: No rash noted.  Psychiatric: He has a normal mood and affect.       Assessment:     Physical exam. Generally healthy 37 year old male. Recent labs from work reviewed with no  major concerns. He does describe bilateral low-grade headache for the past month or so. Sounds most likely typical of chronic tension-type headache    Plan:     -recommend trial of Flonase nasal  1 spray per nostril daily for nasal congestive symptoms -Reviewed red flags for headache and follow-up for any new symptoms or progressive daily headache. We've asked that he be in touch if not seeing improvement with Flonase of the next couple of weeks -keep caffeine intake to a minimum -Consider trial of low-dose nortriptyline or amitriptyline at night if headaches persist  Eulas Post MD [facility] at [facility]

## 2018-02-07 DIAGNOSIS — Z23 Encounter for immunization: Secondary | ICD-10-CM | POA: Diagnosis not present

## 2018-04-07 ENCOUNTER — Ambulatory Visit (INDEPENDENT_AMBULATORY_CARE_PROVIDER_SITE_OTHER): Payer: BLUE CROSS/BLUE SHIELD | Admitting: Family Medicine

## 2018-04-07 ENCOUNTER — Encounter: Payer: Self-pay | Admitting: Family Medicine

## 2018-04-07 ENCOUNTER — Other Ambulatory Visit: Payer: Self-pay

## 2018-04-07 VITALS — BP 118/76 | HR 82 | Temp 98.1°F | Ht 65.75 in | Wt 145.0 lb

## 2018-04-07 DIAGNOSIS — B9789 Other viral agents as the cause of diseases classified elsewhere: Secondary | ICD-10-CM | POA: Diagnosis not present

## 2018-04-07 DIAGNOSIS — J069 Acute upper respiratory infection, unspecified: Secondary | ICD-10-CM

## 2018-04-07 MED ORDER — BENZONATATE 100 MG PO CAPS: ORAL_CAPSULE | ORAL | 1 refills | Status: DC

## 2018-04-07 NOTE — Progress Notes (Signed)
  Subjective:     Patient ID: Roy King, male   DOB: [birth year], 37 y.o.   MRN: [id number]  HPI Patient is seen as a work in with onset last Thursday of URI symptoms of headache, body aches, sore throat, nasal congestion, cough, and fatigue.  Symptoms improved slightly over the weekend.  He slept a lot.  He has had diffuse headaches and is taken Tylenol and naproxen for that.  He is using NyQuil at night.  Not aware of any definite fever.  No known sick contacts.  No nausea or vomiting.  No purulent nasal secretions.  Cough mostly nonproductive  Past Medical History:  Diagnosis Date  . Chicken pox    History reviewed. No pertinent surgical history.  reports that he has never smoked. He has never used smokeless tobacco. He reports current alcohol use. He reports that he does not use drugs. family history includes Breast cancer in his maternal aunt and maternal grandmother; Hearing loss in his mother; Heart disease in his paternal grandfather; Heart disease (age of onset: 8) in his paternal uncle; Hyperlipidemia in his father. No Known Allergies   Review of Systems  Constitutional: Positive for chills and fatigue. Negative for fever.  HENT: Positive for congestion and sore throat. Negative for sinus pain.   Respiratory: Positive for cough. Negative for shortness of breath and wheezing.   Neurological: Positive for headaches.       Objective:   Physical Exam Constitutional:      Appearance: Normal appearance.  HENT:     Right Ear: Tympanic membrane normal.     Left Ear: Tympanic membrane normal.  Neck:     Musculoskeletal: Neck supple.  Cardiovascular:     Rate and Rhythm: Regular rhythm.  Pulmonary:     Effort: Pulmonary effort is normal. No respiratory distress.     Breath sounds: Normal breath sounds. No wheezing or rales.  Lymphadenopathy:     Cervical: No cervical adenopathy.  Skin:    Findings: No rash.  Neurological:     Mental Status: He is alert.         Assessment:     Probable acute viral URI with cough.  Nonfocal exam.  Symptoms gradually improving    Plan:     -Continue plenty of fluids and rest.  Continue Tylenol and/or naproxen as needed for body aches and headache -Tessalon Perles 100 mg 1-2 every 8 hours as needed for cough -We did not check for flu given the fact this was over 48 hours into illness and symptoms slowly improving -Follow-up for any persistent or worsening symptoms  Eulas Post MD [facility] at [facility]

## 2018-04-07 NOTE — Patient Instructions (Signed)

## 2019-01-26 ENCOUNTER — Other Ambulatory Visit: Payer: Self-pay

## 2019-01-26 DIAGNOSIS — Z20828 Contact with and (suspected) exposure to other viral communicable diseases: Secondary | ICD-10-CM | POA: Diagnosis not present

## 2019-01-26 DIAGNOSIS — Z20822 Contact with and (suspected) exposure to covid-19: Secondary | ICD-10-CM

## 2019-01-28 LAB — NOVEL CORONAVIRUS, NAA: SARS-CoV-2, NAA: NOT DETECTED

## 2019-02-11 ENCOUNTER — Encounter: Payer: Self-pay | Admitting: Family Medicine

## 2019-07-03 ENCOUNTER — Encounter: Payer: Self-pay | Admitting: Family Medicine

## 2019-07-03 ENCOUNTER — Telehealth: Payer: Self-pay | Admitting: *Deleted

## 2019-07-03 NOTE — Telephone Encounter (Signed)
Patient had an appointment today. Patient called after hours line and wants to reschedule for the 19th of March anytime before noon. Clinic RN called patient to reschedule appointment. No answer. LVM for patient to return call. If patient returns call please schedule him for CPE.  RN has cancelled appointment for today.

## 2019-07-13 ENCOUNTER — Other Ambulatory Visit: Payer: Self-pay

## 2019-07-13 ENCOUNTER — Encounter: Payer: Self-pay | Admitting: Family Medicine

## 2019-07-13 ENCOUNTER — Ambulatory Visit (INDEPENDENT_AMBULATORY_CARE_PROVIDER_SITE_OTHER): Payer: BC Managed Care – PPO | Admitting: Family Medicine

## 2019-07-13 VITALS — BP 116/78 | HR 76 | Temp 97.7°F | Wt 148.3 lb

## 2019-07-13 DIAGNOSIS — Z0000 Encounter for general adult medical examination without abnormal findings: Secondary | ICD-10-CM | POA: Diagnosis not present

## 2019-07-13 LAB — POCT URINALYSIS DIPSTICK
Glucose, UA: NEGATIVE
Leukocytes, UA: NEGATIVE
Protein, UA: NEGATIVE
Spec Grav, UA: 1.015 (ref 1.010–1.025)
Urobilinogen, UA: 0.2 E.U./dL
pH, UA: 6.5 (ref 5.0–8.0)

## 2019-07-13 LAB — CBC WITH DIFFERENTIAL/PLATELET
Basophils Absolute: 0 10*3/uL (ref 0.0–0.1)
Basophils Relative: 1 % (ref 0.0–3.0)
Eosinophils Absolute: 0.1 10*3/uL (ref 0.0–0.7)
Eosinophils Relative: 2.2 % (ref 0.0–5.0)
HCT: 40.3 % (ref 39.0–52.0)
Hemoglobin: 13.9 g/dL (ref 13.0–17.0)
Lymphocytes Relative: 35.7 % (ref 12.0–46.0)
Lymphs Abs: 1.4 10*3/uL (ref 0.7–4.0)
MCHC: 34.6 g/dL (ref 30.0–36.0)
MCV: 89.4 fl (ref 78.0–100.0)
Monocytes Absolute: 0.4 10*3/uL (ref 0.1–1.0)
Monocytes Relative: 10.1 % (ref 3.0–12.0)
Neutro Abs: 2 10*3/uL (ref 1.4–7.7)
Neutrophils Relative %: 51 % (ref 43.0–77.0)
Platelets: 174 10*3/uL (ref 150.0–400.0)
RBC: 4.51 Mil/uL (ref 4.22–5.81)
RDW: 12.8 % (ref 11.5–15.5)
WBC: 3.9 10*3/uL — ABNORMAL LOW (ref 4.0–10.5)

## 2019-07-13 LAB — LIPID PANEL
Cholesterol: 180 mg/dL (ref 0–200)
HDL: 47 mg/dL (ref >39.00)
LDL Cholesterol: 106 mg/dL — ABNORMAL HIGH (ref 0–99)
NonHDL: 133.46
Total CHOL/HDL Ratio: 4
Triglycerides: 139 mg/dL (ref 0.0–149.0)
VLDL: 27.8 mg/dL (ref 0.0–40.0)

## 2019-07-13 LAB — BASIC METABOLIC PANEL
BUN: 16 mg/dL (ref 6–23)
CO2: 31 mEq/L (ref 19–32)
Calcium: 9.2 mg/dL (ref 8.4–10.5)
Chloride: 101 mEq/L (ref 96–112)
Creatinine, Ser: 0.83 mg/dL (ref 0.40–1.50)
GFR: 103.97 mL/min (ref >60.00)
Glucose, Bld: 85 mg/dL (ref 70–99)
Potassium: 4.2 mEq/L (ref 3.5–5.1)
Sodium: 138 mEq/L (ref 135–145)

## 2019-07-13 LAB — HEPATIC FUNCTION PANEL
ALT: 14 U/L (ref 0–53)
AST: 17 U/L (ref 0–37)
Albumin: 4.6 g/dL (ref 3.5–5.2)
Alkaline Phosphatase: 45 U/L (ref 39–117)
Bilirubin, Direct: 0.1 mg/dL (ref 0.0–0.3)
Total Bilirubin: 0.6 mg/dL (ref 0.2–1.2)
Total Protein: 6.9 g/dL (ref 6.0–8.3)

## 2019-07-13 LAB — TSH: TSH: 1.49 u[IU]/mL (ref 0.35–4.50)

## 2019-07-13 NOTE — Patient Instructions (Signed)
Food Choices for Gastroesophageal Reflux Disease, Adult When you have gastroesophageal reflux disease (GERD), the foods you eat and your eating habits are very important. Choosing the right foods can help ease the discomfort of GERD. Consider working with a diet and nutrition specialist (dietitian) to help you make healthy food choices. What general guidelines should I follow?  Eating plan  Choose healthy foods low in fat, such as fruits, vegetables, whole grains, low-fat dairy products, and lean meat, fish, and poultry.  Eat frequent, small meals instead of three large meals each day. Eat your meals slowly, in a relaxed setting. Avoid bending over or lying down until 2-3 hours after eating.  Limit high-fat foods such as fatty meats or fried foods.  Limit your intake of oils, butter, and shortening to less than 8 teaspoons each day.  Avoid the following: ? Foods that cause symptoms. These may be different for different people. Keep a food diary to keep track of foods that cause symptoms. ? Alcohol. ? Drinking large amounts of liquid with meals. ? Eating meals during the 2-3 hours before bed.  Cook foods using methods other than frying. This may include baking, grilling, or broiling. Lifestyle  Maintain a healthy weight. Ask your health care provider what weight is healthy for you. If you need to lose weight, work with your health care provider to do so safely.  Exercise for at least 30 minutes on 5 or more days each week, or as told by your health care provider.  Avoid wearing clothes that fit tightly around your waist and chest.  Do not use any products that contain nicotine or tobacco, such as cigarettes and e-cigarettes. If you need help quitting, ask your health care provider.  Sleep with the head of your bed raised. Use a wedge under the mattress or blocks under the bed frame to raise the head of the bed. What foods are not recommended? The items listed may not be a complete  list. Talk with your dietitian about what dietary choices are best for you. Grains Pastries or quick breads with added fat. French toast. Vegetables Deep fried vegetables. French fries. Any vegetables prepared with added fat. Any vegetables that cause symptoms. For some people this may include tomatoes and tomato products, chili peppers, onions and garlic, and horseradish. Fruits Any fruits prepared with added fat. Any fruits that cause symptoms. For some people this may include citrus fruits, such as oranges, grapefruit, pineapple, and lemons. Meats and other protein foods High-fat meats, such as fatty beef or pork, hot dogs, ribs, ham, sausage, salami and bacon. Fried meat or protein, including fried fish and fried chicken. Nuts and nut butters. Dairy Whole milk and chocolate milk. Sour cream. Cream. Ice cream. Cream cheese. Milk shakes. Beverages Coffee and tea, with or without caffeine. Carbonated beverages. Sodas. Energy drinks. Fruit juice made with acidic fruits (such as orange or grapefruit). Tomato juice. Alcoholic drinks. Fats and oils Butter. Margarine. Shortening. Ghee. Sweets and desserts Chocolate and cocoa. Donuts. Seasoning and other foods Pepper. Peppermint and spearmint. Any condiments, herbs, or seasonings that cause symptoms. For some people, this may include curry, hot sauce, or vinegar-based salad dressings. Summary  When you have gastroesophageal reflux disease (GERD), food and lifestyle choices are very important to help ease the discomfort of GERD.  Eat frequent, small meals instead of three large meals each day. Eat your meals slowly, in a relaxed setting. Avoid bending over or lying down until 2-3 hours after eating.  Limit high-fat   foods such as fatty meat or fried foods. This information is not intended to replace advice given to you by your health care provider. Make sure you discuss any questions you have with your health care provider. Document Revised:  07/31/2018 Document Reviewed: 04/10/2016 Elsevier Patient Education  [place].  Consider Pepcid 20 mg (OTC) once or twice daily as needed for reflux symptoms.

## 2019-07-13 NOTE — Progress Notes (Signed)
Subjective:     Patient ID: Roy King, male   DOB: [birth year], 39 y.o.   MRN: [id number]  HPI Zyen is seen for physical exam.  He and his wife she had their second child back in October.  Things went well with that.  He has a lot of stress with being a father and working full-time job.  Not getting a lot of sleep.  Usually does not get more than 5 hours continuously.  Has not had any consistent exercise.  Has had tension type headaches in the past and has had some ongoing intermittent headaches that are bilateral.  He recently had flareup with some left flank pain.  He states he had similar back pains in the past with heartburn flareups.  He has had past history of intermittent dysphagia and has history of stricture.  No consistent dysphagia symptoms.  Appetite and weight have been stable.  He started some over-the-counter omeprazole which has controlled his symptoms past few days.  He does not take this consistently all the time.  Symptoms exacerbated by spicy foods.  Family history reviewed.  He had a maternal uncle that had MI at 86.  His dad has not had history of CAD.  Past Medical History:  Diagnosis Date  . Chicken pox    No past surgical history on file.  reports that he has never smoked. He has never used smokeless tobacco. He reports current alcohol use. He reports that he does not use drugs. family history includes Breast cancer in his maternal aunt and maternal grandmother; Hearing loss in his mother; Heart disease in his paternal grandfather; Heart disease (age of onset: 56) in his paternal uncle; Hyperlipidemia in his father. No Known Allergies   Review of Systems  Constitutional: Negative for activity change, appetite change, fatigue and fever.  HENT: Negative for congestion, ear pain and trouble swallowing.   Eyes: Negative for pain and visual disturbance.  Respiratory: Negative for cough, shortness of breath and wheezing.   Cardiovascular: Negative for chest pain and  palpitations.  Gastrointestinal: Negative for abdominal distention, abdominal pain, blood in stool, constipation, diarrhea, nausea, rectal pain and vomiting.  Genitourinary: Positive for flank pain. Negative for dysuria, hematuria and testicular pain.  Musculoskeletal: Positive for back pain. Negative for arthralgias and joint swelling.  Skin: Negative for rash.  Neurological: Negative for dizziness, syncope and headaches.  Hematological: Negative for adenopathy.  Psychiatric/Behavioral: Negative for confusion and dysphoric mood.       Objective:   Physical Exam Vitals reviewed.  Constitutional:      General: He is not in acute distress.    Appearance: He is well-developed.  HENT:     Head: Normocephalic and atraumatic.     Right Ear: External ear normal.     Left Ear: External ear normal.  Eyes:     Conjunctiva/sclera: Conjunctivae normal.     Pupils: Pupils are equal, round, and reactive to light.  Neck:     Thyroid: No thyromegaly.  Cardiovascular:     Rate and Rhythm: Normal rate and regular rhythm.     Heart sounds: Normal heart sounds. No murmur.  Pulmonary:     Effort: No respiratory distress.     Breath sounds: No wheezing or rales.  Abdominal:     General: Bowel sounds are normal. There is no distension.     Palpations: Abdomen is soft. There is no mass.     Tenderness: There is no abdominal tenderness. There is no guarding or rebound.  Musculoskeletal:     Cervical back: Normal range of motion and neck supple.     Right lower leg: No edema.     Left lower leg: No edema.  Lymphadenopathy:     Cervical: No cervical adenopathy.  Skin:    Findings: No rash.  Neurological:     Mental Status: He is alert and oriented to person, place, and time.     Cranial Nerves: No cranial nerve deficit.     Deep Tendon Reflexes: Reflexes normal.        Assessment:     Physical exam.  Jene has no chronic medical problems.  Intermittent reflux symptoms which have been  controlled with over-the-counter Prilosec    Plan:     -Handout on GERD given -We did suggest he try over-the-counter Pepcid 20 mg twice daily as needed -We will check lab work.  Include urine dipstick with his recent intermittent left flank pain. -We will also check HIV antibody though he is low risk.  Eulas Post MD [facility]

## 2019-07-14 LAB — HIV ANTIBODY (ROUTINE TESTING W REFLEX): HIV 1&2 Ab, 4th Generation: NONREACTIVE

## 2019-08-01 DIAGNOSIS — J029 Acute pharyngitis, unspecified: Secondary | ICD-10-CM | POA: Diagnosis not present

## 2019-12-24 DIAGNOSIS — R05 Cough: Secondary | ICD-10-CM | POA: Diagnosis not present

## 2019-12-24 DIAGNOSIS — Z1152 Encounter for screening for COVID-19: Secondary | ICD-10-CM | POA: Diagnosis not present

## 2019-12-24 DIAGNOSIS — J029 Acute pharyngitis, unspecified: Secondary | ICD-10-CM | POA: Diagnosis not present

## 2020-01-05 DIAGNOSIS — R05 Cough: Secondary | ICD-10-CM | POA: Diagnosis not present

## 2020-01-05 DIAGNOSIS — Z20822 Contact with and (suspected) exposure to covid-19: Secondary | ICD-10-CM | POA: Diagnosis not present

## 2020-01-05 DIAGNOSIS — J069 Acute upper respiratory infection, unspecified: Secondary | ICD-10-CM | POA: Diagnosis not present

## 2020-06-02 LAB — CBC: RBC: 4.82 (ref 3.87–5.11)

## 2020-06-02 LAB — CBC AND DIFFERENTIAL
HCT: 43 (ref 41–53)
Hemoglobin: 14.6 (ref 13.5–17.5)
Neutrophils Absolute: 51
Platelets: 195 (ref 150–399)
WBC: 5.4

## 2020-06-02 LAB — BASIC METABOLIC PANEL
BUN: 14 (ref 4–21)
Chloride: 100 (ref 99–108)
Creatinine: 1 (ref 0.6–1.3)
Glucose: 95
Potassium: 4 (ref 3.4–5.3)
Sodium: 138 (ref 137–147)

## 2020-06-02 LAB — LIPID PANEL
Cholesterol: 215 — AB (ref 0–200)
HDL: 49 (ref 35–70)
LDL Cholesterol: 117
LDl/HDL Ratio: 4.4
Triglycerides: 186 — AB (ref 40–160)

## 2020-06-02 LAB — COMPREHENSIVE METABOLIC PANEL
Albumin: 4.8 (ref 3.5–5.0)
Calcium: 9.3 (ref 8.7–10.7)
Globulin: 2.5

## 2020-06-02 LAB — HEPATIC FUNCTION PANEL
ALT: 11 (ref 10–40)
AST: 12 — AB (ref 14–40)
Alkaline Phosphatase: 59 (ref 25–125)

## 2020-06-02 LAB — TSH: TSH: 2.59 (ref 0.41–5.90)

## 2020-07-12 DIAGNOSIS — Z20822 Contact with and (suspected) exposure to covid-19: Secondary | ICD-10-CM | POA: Diagnosis not present

## 2020-07-21 DIAGNOSIS — Z20822 Contact with and (suspected) exposure to covid-19: Secondary | ICD-10-CM | POA: Diagnosis not present

## 2020-07-21 DIAGNOSIS — Z03818 Encounter for observation for suspected exposure to other biological agents ruled out: Secondary | ICD-10-CM | POA: Diagnosis not present

## 2020-08-03 ENCOUNTER — Ambulatory Visit (INDEPENDENT_AMBULATORY_CARE_PROVIDER_SITE_OTHER): Payer: BC Managed Care – PPO | Admitting: Family Medicine

## 2020-08-03 ENCOUNTER — Other Ambulatory Visit: Payer: Self-pay

## 2020-08-03 ENCOUNTER — Encounter: Payer: Self-pay | Admitting: Family Medicine

## 2020-08-03 VITALS — BP 124/62 | HR 81 | Temp 98.0°F | Ht 65.75 in | Wt 146.0 lb

## 2020-08-03 DIAGNOSIS — Z0000 Encounter for general adult medical examination without abnormal findings: Secondary | ICD-10-CM | POA: Diagnosis not present

## 2020-08-03 NOTE — Patient Instructions (Signed)
Coronary Calcium Scan A coronary calcium scan is an imaging test used to look for deposits of plaque in the inner lining of the blood vessels of the heart (coronary arteries). Plaque is made up of calcium, protein, and fatty substances. These deposits of plaque can partly clog and narrow the coronary arteries without producing any symptoms or warning signs. This puts a person at risk for a heart attack. This test is recommended for people who are at moderate risk for heart disease. The test can find plaque deposits before symptoms develop. Tell a health care provider about:  Any allergies you have.  All medicines you are taking, including vitamins, herbs, eye drops, creams, and over-the-counter medicines.  Any problems you or family members have had with anesthetic medicines.  Any blood disorders you have.  Any surgeries you have had.  Any medical conditions you have.  Whether you are pregnant or may be pregnant. What are the risks? Generally, this is a safe procedure. However, problems may occur, including:  Harm to a pregnant woman and her unborn baby. This test involves the use of radiation. Radiation exposure can be dangerous to a pregnant woman and her unborn baby. If you are pregnant or think you may be pregnant, you should not have this procedure done.  Slight increase in the risk of cancer. This is because of the radiation involved in the test. What happens before the procedure? Ask your health care provider for any specific instructions on how to prepare for this procedure. You may be asked to avoid products that contain caffeine, tobacco, or nicotine for 4 hours before the procedure. What happens during the procedure?  You will undress and remove any jewelry from your neck or chest.  You will put on a hospital gown.  Sticky electrodes will be placed on your chest. The electrodes will be connected to an electrocardiogram (ECG) machine to record a tracing of the electrical  activity of your heart.  You will lie down on a curved bed that is attached to the [place].  You may be given medicine to slow down your heart rate so that clear pictures can be created.  You will be moved into the CT scanner, and the CT scanner will take pictures of your heart. During this time, you will be asked to lie still and hold your breath for 2-3 seconds at a time while each picture of your heart is being taken. The procedure may vary among health care providers and hospitals.   What happens after the procedure?  You can get dressed.  You can return to your normal activities.  It is up to you to get the results of your procedure. Ask your health care provider, or the department that is doing the procedure, when your results will be ready. Summary  A coronary calcium scan is an imaging test used to look for deposits of plaque in the inner lining of the blood vessels of the heart (coronary arteries). Plaque is made up of calcium, protein, and fatty substances.  Generally, this is a safe procedure. Tell your health care provider if you are pregnant or may be pregnant.  Ask your health care provider for any specific instructions on how to prepare for this procedure.  A CT scanner will take pictures of your heart.  You can return to your normal activities after the scan is done. This information is not intended to replace advice given to you by your health care provider. Make sure you discuss  any questions you have with your health care provider. Document Revised: 10/28/2018 Document Reviewed: 10/28/2018 Elsevier Patient Education  [place].

## 2020-08-03 NOTE — Progress Notes (Signed)
Established Patient Office Visit  Subjective:  Patient ID: Roy King, male    DOB: Nov 06, 1980  Age: 40 y.o. MRN: [id number]  CC:  Chief Complaint  Patient presents with  . Annual Exam    Has already had blood work and would like to discuss    HPI Roy King presents for annual physical exam.  Roy King actually had labs through work recently.  Labs are all normal with exception of cholesterol 215, triglycerides 186, LDL cholesterol 133, HDL 49.  Thyroid functions, CBC, other chemistries normal.  Generally very healthy.  Takes no regular medications.  Health maintenance reviewed.  Roy King has had COVID vaccines but does not have dates.  Roy King actually had COVID about a month ago.  Fully recovered.  Other vaccines such as tetanus update  Family history-mother has decreased hearing.  Father has hyperlipidemia but no CAD.  Paternal grandfather died of CAD in his 59s.  Roy King had 3 uncles with MI 1 in his 86s.  Social history Roy King is married has 2 children.  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  Non-smoker.  Only usually 2-3 alcoholic beverages per week.  Past Medical History:  Diagnosis Date  . Chicken pox     No past surgical history on file.  Family History  Problem Relation Age of Onset  . Hearing loss Mother   . Hyperlipidemia Father   . Heart disease Paternal Grandfather   . Heart disease Paternal Uncle 52       mi  . Breast cancer Maternal Grandmother   . Breast cancer Maternal Aunt     Social History   Socioeconomic History  . Marital status: Single    Spouse name: Not on file  . Number of children: Not on file  . Years of education: Not on file  . Highest education level: Not on file  Occupational History  . Not on file  Tobacco Use  . Smoking status: Never Smoker  . Smokeless tobacco: Never Used  Vaping Use  . Vaping Use: Never used  Substance and Sexual Activity  . Alcohol use: Yes    Comment: .25 drinks per day; mostly on weekends  . Drug use: No  .  Sexual activity: Yes  Other Topics Concern  . Not on file  Social History Narrative  . Not on file   Social Determinants of Health   Financial Resource Strain: Not on file  Food Insecurity: Not on file  Transportation Needs: Not on file  Physical Activity: Not on file  Stress: Not on file  Social Connections: Not on file  Intimate Partner Violence: Not on file    Outpatient Medications Prior to Visit  Medication Sig Dispense Refill  . omeprazole (PRILOSEC) 40 MG capsule TAKE ONE CAPSULE BY MOUTH DAILY 90 capsule 2   No facility-administered medications prior to visit.    No Known Allergies  ROS Review of Systems  Constitutional: Negative for activity change, appetite change, fatigue and fever.  HENT: Negative for congestion, ear pain and trouble swallowing.   Eyes: Negative for pain and visual disturbance.  Respiratory: Negative for cough, shortness of breath and wheezing.   Cardiovascular: Negative for chest pain and palpitations.  Gastrointestinal: Negative for abdominal distention, abdominal pain, blood in stool, constipation, diarrhea, nausea, rectal pain and vomiting.  Genitourinary: Negative for dysuria, hematuria and testicular pain.  Musculoskeletal: Negative for arthralgias and joint swelling.  Skin: Negative for rash.  Neurological: Negative for dizziness, syncope and headaches.  Hematological: Negative  for adenopathy.  Psychiatric/Behavioral: Negative for confusion and dysphoric mood.      Objective:    Physical Exam Constitutional:      General: Roy King is not in acute distress.    Appearance: Roy King is well-developed.  HENT:     Head: Normocephalic and atraumatic.     Right Ear: External ear normal.     Left Ear: External ear normal.  Eyes:     Conjunctiva/sclera: Conjunctivae normal.     Pupils: Pupils are equal, round, and reactive to light.  Neck:     Thyroid: No thyromegaly.  Cardiovascular:     Rate and Rhythm: Normal rate and regular rhythm.      Heart sounds: Normal heart sounds. No murmur heard.   Pulmonary:     Effort: No respiratory distress.     Breath sounds: No wheezing or rales.  Abdominal:     General: Bowel sounds are normal. There is no distension.     Palpations: Abdomen is soft. There is no mass.     Tenderness: There is no abdominal tenderness. There is no guarding or rebound.  Musculoskeletal:     Cervical back: Normal range of motion and neck supple.  Lymphadenopathy:     Cervical: No cervical adenopathy.  Skin:    Findings: No rash.  Neurological:     Mental Status: Roy King is alert and oriented to person, place, and time.     Cranial Nerves: No cranial nerve deficit.     BP 124/62 (BP Location: Left Arm, Patient Position: Sitting, Cuff Size: Normal)   Pulse 81   Temp 98 F (36.7 C) (Oral)   Ht 5' 5.75" (1.67 m)   Wt 146 lb (66.2 kg)   SpO2 96%   BMI 23.74 kg/m  Wt Readings from Last 3 Encounters:  08/03/20 146 lb (66.2 kg)  07/13/19 148 lb 4.8 oz (67.3 kg)  04/07/18 145 lb (65.8 kg)     Health Maintenance Due  Topic Date Due  . Hepatitis C Screening  Never done  . COVID-19 Vaccine (1) Never done    There are no preventive care reminders to display for this patient.  Lab Results  Component Value Date   TSH 2.59 06/02/2020   Lab Results  Component Value Date   WBC 5.4 06/02/2020   HGB 14.6 06/02/2020   HCT 43 06/02/2020   MCV 89.4 07/13/2019   PLT 195 06/02/2020   Lab Results  Component Value Date   NA 138 06/02/2020   K 4.0 06/02/2020   CO2 31 07/13/2019   GLUCOSE 85 07/13/2019   BUN 14 06/02/2020   CREATININE 1.0 06/02/2020   BILITOT 0.6 07/13/2019   ALKPHOS 59 06/02/2020   AST 12 (A) 06/02/2020   ALT 11 06/02/2020   PROT 6.9 07/13/2019   ALBUMIN 4.8 06/02/2020   CALCIUM 9.3 06/02/2020   GFR 103.97 07/13/2019   Lab Results  Component Value Date   CHOL 215 (A) 06/02/2020   Lab Results  Component Value Date   HDL 49 06/02/2020   Lab Results  Component Value Date    LDLCALC 117 06/02/2020   Lab Results  Component Value Date   TRIG 186 (A) 06/02/2020   Lab Results  Component Value Date   CHOLHDL 4 07/13/2019   No results found for: HGBA1C    Assessment & Plan:   Problem List Items Addressed This Visit   None   Visit Diagnoses    Physical exam    -  Primary    Healthy 40 year old male.  Roy King already had lab work through work which is reviewed.  Mild hyperlipidemia.  Does have family history of CAD in several uncles and a grandfather but no first-degree relatives  -Discussed primary prevention of CAD with low saturated fat diet and regular exercise -We did discuss consideration for possible coronary calcium score at age 4.  Handout given and Roy King will consider. -Consider hepatitis C antibody with next blood draw -Recommend minimum of 150 minutes/week of moderate intensity exercise  No orders of the defined types were placed in this encounter.   Follow-up: No follow-ups on file.    Carolann Littler, MD

## 2020-11-15 ENCOUNTER — Other Ambulatory Visit: Payer: Self-pay

## 2020-11-15 ENCOUNTER — Ambulatory Visit: Payer: BC Managed Care – PPO | Admitting: Family Medicine

## 2020-11-15 ENCOUNTER — Ambulatory Visit (INDEPENDENT_AMBULATORY_CARE_PROVIDER_SITE_OTHER): Payer: BC Managed Care – PPO | Admitting: Family Medicine

## 2020-11-15 ENCOUNTER — Encounter: Payer: Self-pay | Admitting: Family Medicine

## 2020-11-15 VITALS — BP 140/78 | HR 79 | Temp 98.0°F | Wt 149.5 lb

## 2020-11-15 DIAGNOSIS — R1310 Dysphagia, unspecified: Secondary | ICD-10-CM

## 2020-11-15 DIAGNOSIS — K219 Gastro-esophageal reflux disease without esophagitis: Secondary | ICD-10-CM

## 2020-11-15 MED ORDER — PANTOPRAZOLE SODIUM 40 MG PO TBEC: 40.0000 mg | DELAYED_RELEASE_TABLET | Freq: Every day | ORAL | 3 refills | Status: DC

## 2020-11-15 NOTE — Progress Notes (Signed)
Established Patient Office Visit  Subjective:  Patient ID: Roy King, male    DOB: 06-27-1980  Age: 40 y.o. MRN: [id number]  CC: No chief complaint on file.   HPI Roy King presents for some intermittent solid food dysphagia.  He has had some intermittent reflux for years.  At one point was on proton pump inhibitor but he tapered himself off.  He had diagnostic esophagus x-ray at that point which showed slight narrowing at the gastroesophageal junction and 13 mm barium pill would not pass.  His symptoms though are somewhat infrequent at that point with reflux.  Still has somewhat inconsistent dysphagia symptoms.  No dysphagia to liquids.  He is noted with foods such as steak symptoms are much worse.  Has had difficulty finishing a meal couple of times secondary to food lodging in the esophagus.  Has had to regurgitate food a few times.  His appetite and weight are stable.  No abdominal pain.  Non-smoker.  No regular alcohol use.  Occasional sensation of dyspnea when food is lodged and not passing well through the esophagus.  Past Medical History:  Diagnosis Date   Chicken pox     No past surgical history on file.  Family History  Problem Relation Age of Onset   Hearing loss Mother    Hyperlipidemia Father    Heart disease Paternal Grandfather    Heart disease Paternal Uncle 67       mi   Breast cancer Maternal Grandmother    Breast cancer Maternal Aunt     Social History   Socioeconomic History   Marital status: Single    Spouse name: Not on file   Number of children: Not on file   Years of education: Not on file   Highest education level: Not on file  Occupational History   Not on file  Tobacco Use   Smoking status: Never   Smokeless tobacco: Never  Vaping Use   Vaping Use: Never used  Substance and Sexual Activity   Alcohol use: Yes    Comment: .25 drinks per day; mostly on weekends   Drug use: No   Sexual activity: Yes  Other Topics Concern   Not on file   Social History Narrative   Not on file   Social Determinants of Health   Financial Resource Strain: Not on file  Food Insecurity: Not on file  Transportation Needs: Not on file  Physical Activity: Not on file  Stress: Not on file  Social Connections: Not on file  Intimate Partner Violence: Not on file    No outpatient medications prior to visit.   No facility-administered medications prior to visit.    No Known Allergies  ROS Review of Systems  Constitutional:  Negative for appetite change and unexpected weight change.  Respiratory:  Negative for cough.   Cardiovascular:  Negative for chest pain.  Gastrointestinal:  Negative for abdominal pain.     Objective:    Physical Exam Vitals reviewed.  Constitutional:      Appearance: Normal appearance.  Cardiovascular:     Rate and Rhythm: Normal rate and regular rhythm.  Pulmonary:     Effort: Pulmonary effort is normal.     Breath sounds: Normal breath sounds.  Abdominal:     Palpations: Abdomen is soft. There is no mass.     Tenderness: There is no abdominal tenderness.  Neurological:     Mental Status: He is alert.    BP 140/78 (BP Location: Right Arm,  Patient Position: Sitting, Cuff Size: Normal)   Pulse 79   Temp 98 F (36.7 C) (Oral)   Wt 149 lb 8 oz (67.8 kg)   SpO2 98%   BMI 24.31 kg/m  Wt Readings from Last 3 Encounters:  11/15/20 149 lb 8 oz (67.8 kg)  08/03/20 146 lb (66.2 kg)  07/13/19 148 lb 4.8 oz (67.3 kg)     Health Maintenance Due  Topic Date Due   COVID-19 Vaccine (1) Never done   Hepatitis C Screening  Never done    There are no preventive care reminders to display for this patient.  Lab Results  Component Value Date   TSH 2.59 06/02/2020   Lab Results  Component Value Date   WBC 5.4 06/02/2020   HGB 14.6 06/02/2020   HCT 43 06/02/2020   MCV 89.4 07/13/2019   PLT 195 06/02/2020   Lab Results  Component Value Date   NA 138 06/02/2020   K 4.0 06/02/2020   CO2 31  07/13/2019   GLUCOSE 85 07/13/2019   BUN 14 06/02/2020   CREATININE 1.0 06/02/2020   BILITOT 0.6 07/13/2019   ALKPHOS 59 06/02/2020   AST 12 (A) 06/02/2020   ALT 11 06/02/2020   PROT 6.9 07/13/2019   ALBUMIN 4.8 06/02/2020   CALCIUM 9.3 06/02/2020   GFR 103.97 07/13/2019   Lab Results  Component Value Date   CHOL 215 (A) 06/02/2020   Lab Results  Component Value Date   HDL 49 06/02/2020   Lab Results  Component Value Date   LDLCALC 117 06/02/2020   Lab Results  Component Value Date   TRIG 186 (A) 06/02/2020   Lab Results  Component Value Date   CHOLHDL 4 07/13/2019   No results found for: HGBA1C    Assessment & Plan:   Problem List Items Addressed This Visit   None Visit Diagnoses     Dysphagia, unspecified type    -  Primary   Relevant Orders   Ambulatory referral to Gastroenterology   Gastroesophageal reflux disease, unspecified whether esophagitis present       Relevant Medications   pantoprazole (PROTONIX) 40 MG tablet     Patient has had somewhat progressive solid food dysphagia recently.  Symptoms are still not entirely consistent which raises the issue of whether this is more esophageal dysmotility versus stricture versus other etiology such as eosinophilic esophagitis (less likely) -Start back Protonix 40 mg once daily -Set up referral to get back into see gastroenterologist  Meds ordered this encounter  Medications   pantoprazole (PROTONIX) 40 MG tablet    Sig: Take 1 tablet (40 mg total) by mouth daily.    Dispense:  30 tablet    Refill:  3    Follow-up: No follow-ups on file.    Carolann Littler, MD

## 2020-11-23 DIAGNOSIS — Z20822 Contact with and (suspected) exposure to covid-19: Secondary | ICD-10-CM | POA: Diagnosis not present

## 2020-12-01 ENCOUNTER — Ambulatory Visit (INDEPENDENT_AMBULATORY_CARE_PROVIDER_SITE_OTHER): Payer: BC Managed Care – PPO | Admitting: Nurse Practitioner

## 2020-12-01 ENCOUNTER — Encounter: Payer: Self-pay | Admitting: Nurse Practitioner

## 2020-12-01 VITALS — BP 90/60 | HR 64 | Ht 68.25 in | Wt 149.2 lb

## 2020-12-01 DIAGNOSIS — R1319 Other dysphagia: Secondary | ICD-10-CM | POA: Diagnosis not present

## 2020-12-01 DIAGNOSIS — K219 Gastro-esophageal reflux disease without esophagitis: Secondary | ICD-10-CM

## 2020-12-01 NOTE — Progress Notes (Signed)
12/01/2020 Gian Mumper [id number] [birth year]   CHIEF COMPLAINT: Dysphagia   HISTORY OF PRESENT ILLNESS:  Roy King is a 40 year old male   He presents to our office today as referred by Dr. Elease Hashimoto for further evaluation regarding dysphagia and reflux. He reports having heartburn most days for years. He has intermittent dysphagia which occurs a few times weekly and when stress level is high. He describes having food which gets stuck to the throat/upper esophagus, he often gags and expels out the stuck food. Sometimes the stuck food passes down the esophagus spontaneously. Drinking water makes his symptoms worse and the water backs up. No hematemesis. No upper or lower abdominal pain. Infrequent NSAID use. Limited alcohol use.  He started taking pantoprazole 40 mg daily 2 weeks ago and his heartburn and dysphagia symptoms have slightly improved.  He denies having any upper or lower abdominal pain.  He is passing normal formed brown bowel movement daily.  No rectal bleeding or black stools.  No known family history of esophageal, gastric or colon cancer.  He initially saw Dr. Hilarie Fredrickson in office 02/05/2013 guarding dysphagia.  At that time, an EGD was recommended, however, the patient did not wish to pursue endoscopic evaluation at that time.  He underwent a barium swallow on 02/06/2013 which showed a slight narrowing at the gastroesophageal junction through which a 13 mm barium pill would not pass.  The EGD was further recommended but the patient did not wish to schedule this procedure as he had concerns regarding his insurance coverage and cost of the procedure. He was advised at that point to contact his health insurance carrier and to call our office  when he was ready to schedule the EGD which was not done.    CBC Latest Ref Rng & Units 06/02/2020 07/13/2019 03/28/2015  WBC - 5.4 3.9(L) 5.4  Hemoglobin 13.5 - 17.5 14.6 13.9 14.4  Hematocrit 41 - 53 43 40.3 42.6  Platelets 150 - 399 195  174.0 157.0     CMP Latest Ref Rng & Units 06/02/2020 07/13/2019 03/28/2015  Glucose 70 - 99 mg/dL - 85 81  BUN 4 - '21 14 16 14  '$ Creatinine 0.6 - 1.3 1.0 0.83 0.87  Sodium 137 - 147 138 138 139  Potassium 3.4 - 5.3 4.0 4.2 4.0  Chloride 99 - 108 100 101 101  CO2 19 - 32 mEq/L - 31 30  Calcium 8.7 - 10.7 9.3 9.2 9.4  Total Protein 6.0 - 8.3 g/dL - 6.9 7.0  Total Bilirubin 0.2 - 1.2 mg/dL - 0.6 0.5  Alkaline Phos 25 - 125 59 45 49  AST 14 - 40 12(A) 17 14  ALT 10 - 40 '11 14 10      '$ Past Medical History:  Diagnosis Date   Chicken pox    No past surgical history  Social History: He is married. He has 2 sons. He is a Customer service manager. Nonsmoker. He drinks  [id number] alcoholic beverages daily or less. No drug use.   Family History: Father with hyperlipidemia. Paternal grandmother with breast cancer. Maternal grandmother with Alzheimer's disease. Paternal grandfather with heart disease. Paternal uncle died from MI at the age of 45. Two other paternal uncles with heart disease. No known family history of esophageal, gastric or colon cancer.   No Known Allergies   Outpatient Encounter Medications as of 12/01/2020  Medication Sig   pantoprazole (PROTONIX) 40 MG tablet Take 1 tablet (40 mg total) by mouth daily.  No facility-administered encounter medications on file as of 12/01/2020.    REVIEW OF SYSTEMS:  Gen: Denies fever, sweats or chills. No weight loss.  CV: Denies chest pain, palpitations or edema. Resp: Denies cough, shortness of breath of hemoptysis.  GI: See HPI.  GU : Denies urinary burning, blood in urine, increased urinary frequency or incontinence. MS: Denies joint pain, muscles aches or weakness. Derm: Denies rash, itchiness, skin lesions or unhealing ulcers. Psych: Denies depression, anxiety or memory loss.  Heme: Denies bruising, bleeding. Neuro:  + Headaches.  Endo:  Denies any problems with DM, thyroid or adrenal function.  PHYSICAL EXAM: BP 90/60 (BP Location: Left Arm,  Patient Position: Sitting, Cuff Size: Normal)   Pulse 64   Ht 5' 8.25" (1.734 m) Comment: height measured without shoes  Wt 149 lb 4 oz (67.7 kg)   BMI 22.53 kg/m   General: 40 year old male in no acute distress. Head: Normocephalic and atraumatic. Eyes:  Sclerae non-icteric, conjunctive pink. Ears: Normal auditory acuity. Mouth: Dentition intact. No ulcers or lesions.  Neck: Supple, no lymphadenopathy or thyromegaly.  Lungs: Clear bilaterally to auscultation without wheezes, crackles or rhonchi. Heart: Regular rate and rhythm. No murmur, rub or gallop appreciated.  Abdomen: Soft, nontender, non distended. No masses. No hepatosplenomegaly. Normoactive bowel sounds x 4 quadrants.  Rectal: Deferred.  Musculoskeletal: Symmetrical with no gross deformities. Skin: Warm and dry. No rash or lesions on visible extremities. Extremities: No edema. Neurological: Alert oriented x 4, no focal deficits.  Psychological:  Alert and cooperative. Normal mood and affect.  ASSESSMENT AND PLAN:  40 year old male with chronic GERD and dysphagia. Barium swallow in 2014 showed a sight narrowing at the GE junction, barium tablet did not pass. Patient previously declined EGD.  On PPI 2016 - 2018. Recurrent reflux and dysphagia symptoms improved since starting Pantoprazole 2 weeks ago.  -EGD with possible esophageal dilatation, benefits and risks discussed including risk with sedation, risk of bleeding, perforation and infection.  I explained the EGD with possible esophageal dilatation procedure in full detail and answered all of his questions. -Continue Pantoprazole '40mg'$  QD -GERD handout  -Further recommendations to be determined after EGD completed       CC:  Burchette, Alinda Sierras, MD

## 2020-12-01 NOTE — Progress Notes (Signed)
Addendum: Reviewed and agree with assessment and management plan. Lucrecia Mcphearson M, MD  

## 2020-12-01 NOTE — Patient Instructions (Addendum)
PROCEDURES: You have been scheduled for a EGD. Please follow the written instructions given to you at your visit today. If you use inhalers (even only as needed), please bring them with you on the day of your procedure.  Continue Pantoprazole 40 MG once a day.  Follow GERD diet. It was great seeing you today! Thank you for entrusting me with your care and choosing [facility].  Noralyn Pick, CRNP  Gastroesophageal Reflux Disease, Adult  Gastroesophageal reflux (GER) happens when acid from the stomach flows up into the tube that connects the mouth and the stomach (esophagus). Normally, food travels down the esophagus and stays in the stomach to be digested. With GER, food and stomach acid sometimes move back up into theesophagus. You may have a disease called gastroesophageal reflux disease (GERD) if the reflux: Happens often. Causes frequent or very bad symptoms. Causes problems such as damage to the esophagus. When this happens, the esophagus becomes sore and swollen. Over time, GERD can make small holes (ulcers) in the lining of the esophagus. What are the causes? This condition is caused by a problem with the muscle between the esophagus and the stomach. When this muscle is weak or not normal, it does not close properlyto keep food and acid from coming back up from the stomach. The muscle can be weak because of: Tobacco use. Pregnancy. Having a certain type of hernia (hiatal hernia). Alcohol use. Certain foods and drinks, such as coffee, chocolate, onions, and peppermint. What increases the risk? Being overweight. Having a disease that affects your connective tissue. Taking NSAIDs, such a ibuprofen. What are the signs or symptoms? Heartburn. Difficult or painful swallowing. The feeling of having a lump in the throat. A bitter taste in the mouth. Bad breath. Having a lot of saliva. Having an upset or bloated stomach. Burping. Chest pain. Different  conditions can cause chest pain. Make sure you see your doctor if you have chest pain. Shortness of breath or wheezing. A long-term cough or a cough at night. Wearing away of the surface of teeth (tooth enamel). Weight loss. How is this treated? Making changes to your diet. Taking medicine. Having surgery. Treatment will depend on how bad your symptoms are. Follow these instructions at home: Eating and drinking  Follow a diet as told by your doctor. You may need to avoid foods and drinks such as: Coffee and tea, with or without caffeine. Drinks that contain alcohol. Energy drinks and sports drinks. Bubbly (carbonated) drinks or sodas. Chocolate and cocoa. Peppermint and mint flavorings. Garlic and onions. Horseradish. Spicy and acidic foods. These include peppers, chili powder, curry powder, vinegar, hot sauces, and BBQ sauce. Citrus fruit juices and citrus fruits, such as oranges, lemons, and limes. Tomato-based foods. These include red sauce, chili, salsa, and pizza with red sauce. Fried and fatty foods. These include donuts, french fries, potato chips, and high-fat dressings. High-fat meats. These include hot dogs, rib eye steak, sausage, ham, and bacon. High-fat dairy items, such as whole milk, butter, and cream cheese. Eat small meals often. Avoid eating large meals. Avoid drinking large amounts of liquid with your meals. Avoid eating meals during the 2-3 hours before bedtime. Avoid lying down right after you eat. Do not exercise right after you eat.  Lifestyle  Do not smoke or use any products that contain nicotine or tobacco. If you need help quitting, ask your doctor. Try to lower your stress. If you need help doing this, ask your doctor. If you are  overweight, lose an amount of weight that is healthy for you. Ask your doctor about a safe weight loss goal.  General instructions Pay attention to any changes in your symptoms. Take over-the-counter and prescription  medicines only as told by your doctor. Do not take aspirin, ibuprofen, or other NSAIDs unless your doctor says it is okay. Wear loose clothes. Do not wear anything tight around your waist. Raise (elevate) the head of your bed about 6 inches (15 cm). You may need to use a wedge to do this. Avoid bending over if this makes your symptoms worse. Keep all follow-up visits. Contact a doctor if: You have new symptoms. You lose weight and you do not know why. You have trouble swallowing or it hurts to swallow. You have wheezing or a cough that keeps happening. You have a hoarse voice. Your symptoms do not get better with treatment. Get help right away if: You have sudden pain in your arms, neck, jaw, teeth, or back. You suddenly feel sweaty, dizzy, or light-headed. You have chest pain or shortness of breath. You vomit and the vomit is green, yellow, or black, or it looks like blood or coffee grounds. You faint. Your poop (stool) is red, bloody, or black. You cannot swallow, drink, or eat. These symptoms may represent a serious problem that is an emergency. Do not wait to see if the symptoms will go away. Get medical help right away. Call your local emergency services (911 in the [place] Do not drive yourself to the hospital. Summary If a person has gastroesophageal reflux disease (GERD), food and stomach acid move back up into the esophagus and cause symptoms or problems such as damage to the esophagus. Treatment will depend on how bad your symptoms are. Follow a diet as told by your doctor. Take all medicines only as told by your doctor. This information is not intended to replace advice given to you by your health care provider. Make sure you discuss any questions you have with your healthcare provider. Document Revised: 10/19/2019 Document Reviewed: 10/19/2019 Elsevier Patient Education  2022 [place] for Gastroesophageal Reflux Disease, Adult When you have  gastroesophageal reflux disease (GERD), the foods you eat and your eating habits are very important. Choosing the right foods can help ease the discomfort of GERD. Consider working with a dietitian to help you [employer] choices. What are tips for following this plan? Reading food labels Look for foods that are low in saturated fat. Foods that have less than 5% of daily value (DV) of fat and 0 g of trans fats may help with your symptoms. Cooking Cook foods using methods other than frying. This may include baking, steaming, grilling, or broiling. These are all methods that do not need a lot of fat for cooking. To add flavor, try to use herbs that are low in spice and acidity. Meal planning  Choose healthy foods that are low in fat, such as fruits, vegetables, whole grains, low-fat dairy products, lean meats, fish, and poultry. Eat frequent, small meals instead of three large meals each day. Eat your meals slowly, in a relaxed setting. Avoid bending over or lying down until 2-3 hours after eating. Limit high-fat foods such as fatty meats or fried foods. Limit your intake of fatty foods, such as oils, butter, and shortening. Avoid the following as told by your health care provider: Foods that cause symptoms. These may be different for different people. Keep a food diary to keep track of foods  that cause symptoms. Alcohol. Drinking large amounts of liquid with meals. Eating meals during the 2-3 hours before bed.  Lifestyle Maintain a healthy weight. Ask your health care provider what weight is healthy for you. If you need to lose weight, work with your health care provider to do so safely. Exercise for at least 30 minutes on 5 or more days each week, or as told by your health care provider. Avoid wearing clothes that fit tightly around your waist and chest. Do not use any products that contain nicotine or tobacco. These products include cigarettes, chewing tobacco, and vaping devices, such  as e-cigarettes. If you need help quitting, ask your health care provider. Sleep with the head of your bed raised. Use a wedge under the mattress or blocks under the bed frame to raise the head of the bed. Chew sugar-free gum after mealtimes. What foods should I eat?  Eat a healthy, well-balanced diet of fruits, vegetables, whole grains, low-fat dairy products, lean meats, fish, and poultry. Each person is different. Foods that may trigger symptoms in one person may not trigger any symptoms in another person. Work with your health care provider to identify foods that are safe foryou. The items listed above may not be a complete list of recommended foods and beverages. Contact a dietitian for more information. What foods should I avoid? Limiting some of these foods may help manage the symptoms of GERD. Everyone is different. Consult a dietitian or your health care provider to help youidentify the exact foods to avoid, if any. Fruits Any fruits prepared with added fat. Any fruits that cause symptoms. For some people this may include citrus fruits, such as oranges, grapefruit, pineapple,and lemons. Vegetables Deep-fried vegetables. [place] fries. Any vegetables prepared with added fat. Any vegetables that cause symptoms. For some people, this may include tomatoesand tomato products, chili peppers, onions and garlic, and horseradish. Grains Pastries or quick breads with added fat. Meats and other proteins High-fat meats, such as fatty beef or pork, hot dogs, ribs, ham, sausage, salami, and bacon. Fried meat or protein, including fried fish and friedchicken. Nuts and nut butters, in large amounts. Dairy Whole milk and chocolate milk. Sour cream. Cream. Ice cream. Cream cheese.Milkshakes. Fats and oils Butter. Margarine. Shortening. Ghee. Beverages Coffee and tea, with or without caffeine. Carbonated beverages. Sodas. Energy drinks. Fruit juice made with acidic fruits, such as orange or  grapefruit.Tomato juice. Alcoholic drinks. Sweets and desserts Chocolate and cocoa. Donuts. Seasonings and condiments Pepper. Peppermint and spearmint. Added salt. Any condiments, herbs, or seasonings that cause symptoms. For some people, this may include curry, hotsauce, or vinegar-based salad dressings. The items listed above may not be a complete list of foods and beverages to avoid. Contact a dietitian for more information. Questions to ask your health care provider Diet and lifestyle changes are usually the first steps that are taken to manage symptoms of GERD. If diet and lifestyle changes do not improve your symptoms,talk with your health care provider about taking medicines. Where to find more information International Foundation for Gastrointestinal Disorders: aboutgerd.org Summary When you have gastroesophageal reflux disease (GERD), food and lifestyle choices may be very helpful in easing the discomfort of GERD. Eat frequent, small meals instead of three large meals each day. Eat your meals slowly, in a relaxed setting. Avoid bending over or lying down until 2-3 hours after eating. Limit high-fat foods such as fatty meats or fried foods. This information is not intended to replace advice given to you by  your health care provider. Make sure you discuss any questions you have with your healthcare provider. Document Revised: 10/19/2019 Document Reviewed: 10/19/2019 Elsevier Patient Education  [place]. If you are age 34 or younger, your body mass index should be between 19-25. Your Body mass index is 22.53 kg/m. If this is out of the aformentioned range listed, please consider follow up with your Primary Care Provider.   The [facility] providers would like to encourage you to use [facility] to communicate with providers for non-urgent requests or questions.  Due to long hold times on the telephone, sending your provider a message by [facility] may be faster and more efficient way to  get a response. Please allow 48 business hours for a response.  Please remember that this is for non-urgent requests/questions.

## 2020-12-29 ENCOUNTER — Encounter: Payer: BC Managed Care – PPO | Admitting: Internal Medicine

## 2021-01-05 ENCOUNTER — Telehealth: Payer: Self-pay | Admitting: *Deleted

## 2021-01-05 ENCOUNTER — Other Ambulatory Visit: Payer: Self-pay

## 2021-01-05 ENCOUNTER — Encounter: Payer: Self-pay | Admitting: Internal Medicine

## 2021-01-05 ENCOUNTER — Ambulatory Visit (AMBULATORY_SURGERY_CENTER): Payer: BC Managed Care – PPO | Admitting: Internal Medicine

## 2021-01-05 VITALS — BP 127/73 | HR 66 | Temp 98.0°F | Resp 10 | Ht 68.0 in | Wt 149.0 lb

## 2021-01-05 DIAGNOSIS — R1319 Other dysphagia: Secondary | ICD-10-CM | POA: Diagnosis not present

## 2021-01-05 DIAGNOSIS — K219 Gastro-esophageal reflux disease without esophagitis: Secondary | ICD-10-CM | POA: Diagnosis not present

## 2021-01-05 DIAGNOSIS — R1310 Dysphagia, unspecified: Secondary | ICD-10-CM | POA: Diagnosis not present

## 2021-01-05 MED ORDER — SODIUM CHLORIDE 0.9 % IV SOLN: 500.0000 mL | Freq: Once | INTRAVENOUS | Status: DC

## 2021-01-05 NOTE — Op Note (Signed)
[facility] Patient Name: Roy King Procedure Date: 01/05/2021 1:57 PM MRN: [id number] Endoscopist: Jerene Bears , MD Age: 40 Referring MD:  Date of Birth: 13-Jul-1980 Gender: Male Account #: [id number] Procedure:                Upper GI endoscopy Indications:              Dysphagia, abnormal barium esophagram in 2014                            suggesting GE junction stricture impeding barium                            tablet Medicines:                Monitored Anesthesia Care Procedure:                Pre-Anesthesia Assessment:                           - Prior to the procedure, a History and Physical                            was performed, and patient medications and                            allergies were reviewed. The patient's tolerance of                            previous anesthesia was also reviewed. The risks                            and benefits of the procedure and the sedation                            options and risks were discussed with the patient.                            All questions were answered, and informed consent                            was obtained. Prior Anticoagulants: The patient has                            taken no previous anticoagulant or antiplatelet                            agents. ASA Grade Assessment: II - A patient with                            mild systemic disease. After reviewing the risks                            and benefits, the patient was deemed in  satisfactory condition to undergo the procedure.                           After obtaining informed consent, the endoscope was                            passed under direct vision. Throughout the                            procedure, the patient's blood pressure, pulse, and                            oxygen saturations were monitored continuously. The                            Endoscope was introduced through the mouth, and                             advanced to the second part of duodenum. The upper                            GI endoscopy was accomplished without difficulty.                            The patient tolerated the procedure well. Findings:                 Two benign-appearing, intrinsic moderate                            (circumferential scarring or stenosis; an endoscope                            may pass) stenoses were found 38 to 39 cm from the                            incisors. The narrowest stenosis measured 7 mm                            (inner diameter) x 1 cm (in length) at the GE                            junction. The stenoses were traversed with                            resistance. After passing the upper endoscope there                            was a mucosal rent causing stricture disruption                            with improvement in luminal narrowing. The  endoscope passed freely thereafter across the GE                            junction.                           Normal mucosa was found in the entire esophagus                            with no convincing evidence of EoE. This was                            biopsied with a cold forceps for evaluation of                            eosinophilic esophagitis (distal and proximal                            esophagus).                           The cardia and gastric fundus were normal on                            retroflexion.                           The entire examined stomach was normal.                           The examined duodenum was normal. Complications:            No immediate complications. Estimated Blood Loss:     Estimated blood loss was minimal. 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 Dilation with the endoscope today as                            above.                           - Normal mucosa was found in the entire  esophagus.                            Biopsied.                           - Normal stomach.                           - Normal examined duodenum.                           - Due to network issue images were not able to be  obtained via [employer]. Images obtained and will be                            added the patients medical record. Recommendation:           - Patient has a contact number available for                            emergencies. The signs and symptoms of potential                            delayed complications were discussed with the                            patient. Return to normal activities tomorrow.                            Written discharge instructions were provided to the                            patient.                           - Advance diet as tolerated.                           - Continue present medications.                           - Await pathology results.                           - Repeat upper endoscopy in 1 month for retreatment. Jerene Bears, MD 01/05/2021 2:03:55 PM This report has been signed electronically.

## 2021-01-05 NOTE — Patient Instructions (Signed)
Advance your diet as tolerated, start with soft food first.  Gargle with warm salt water or use cepacol spray or lozenges as needed for sore throat.  Await pathology results. Repeat EGD scheduled for 02/15/21 at 10:00 am, arrive at 09:00 am.  Nothing by mouth 3 hrs prior to arriving, and no solid food after midnight on 02/14/21.   YOU HAD AN ENDOSCOPIC PROCEDURE TODAY AT [facility]:   Refer to the procedure report that was given to you for any specific questions about what was found during the examination.  If the procedure report does not answer your questions, please call your gastroenterologist to clarify.  If you requested that your care partner not be given the details of your procedure findings, then the procedure report has been included in a sealed envelope for you to review at your convenience later.  YOU SHOULD EXPECT: Some feelings of bloating in the abdomen. Passage of more gas than usual.  Walking can help get rid of the air that was put into your GI tract during the procedure and reduce the bloating. If you had a lower endoscopy (such as a colonoscopy or flexible sigmoidoscopy) you may notice spotting of blood in your stool or on the toilet paper. If you underwent a bowel prep for your procedure, you may not have a normal bowel movement for a few days.  Please Note:  You might notice some irritation and congestion in your nose or some drainage.  This is from the oxygen used during your procedure.  There is no need for concern and it should clear up in a day or so.  SYMPTOMS TO REPORT IMMEDIATELY: Following upper endoscopy (EGD)  Vomiting of blood or coffee ground material  New chest pain or pain under the shoulder blades  Painful or persistently difficult swallowing  New shortness of breath  Fever of 100F or higher  Black, tarry-looking stools  For urgent or emergent issues, a gastroenterologist can be reached at any hour by calling [phone]. Do not use  MyChart messaging for urgent concerns.    DIET:  We do recommend a small meal at first, but then you may proceed to your regular diet.  Drink plenty of fluids but you should avoid alcoholic beverages for 24 hours.  ACTIVITY:  You should plan to take it easy for the rest of today and you should NOT DRIVE or use heavy machinery until tomorrow (because of the sedation medicines used during the test).    FOLLOW UP: Our staff will call the number listed on your records 48-72 hours following your procedure to check on you and address any questions or concerns that you may have regarding the information given to you following your procedure. If we do not reach you, we will leave a message.  We will attempt to reach you two times.  During this call, we will ask if you have developed any symptoms of COVID 19. If you develop any symptoms (ie: fever, flu-like symptoms, shortness of breath, cough etc.) before then, please call [phone].  If you test positive for Covid 19 in the 2 weeks post procedure, please call and report this information to [place].    If any biopsies were taken you will be contacted by phone or by letter within the next 1-3 weeks.  Please call us at [phone] if you have not heard about the biopsies in 3 weeks.    SIGNATURES/CONFIDENTIALITY: You and/or your care partner have signed paperwork which will  be entered into your electronic medical record.  These signatures attest to the fact that that the information above on your After Visit Summary has been reviewed and is understood.  Full responsibility of the confidentiality of this discharge information lies with you and/or your care-partner.

## 2021-01-05 NOTE — Progress Notes (Signed)
Called to room to assist during endoscopic procedure.  Patient ID and intended procedure confirmed with present staff. Received instructions for my participation in the procedure from the performing physician.  

## 2021-01-05 NOTE — Progress Notes (Signed)
GASTROENTEROLOGY PROCEDURE H&P NOTE   Primary Care Physician: Eulas Post, MD    Reason for Procedure:  Esophageal dysphagia  Plan:    Upper endoscopy with probable dilation  Patient is appropriate for endoscopic procedure(s) in the ambulatory ([place]) setting.  The nature of the procedure, as well as the risks, benefits, and alternatives were carefully and thoroughly reviewed with the patient. Ample time for discussion and questions allowed. The patient understood, was satisfied, and agreed to proceed.     HPI: Roy King is a 40 y.o. male who presents for EGD to evaluate dysphagia symptom.  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  Seen in the office on 12/01/2020 by Carl Best, NP.  No change in medical history or complaint since that time.  Past Medical History:  Diagnosis Date   Chicken pox    GERD (gastroesophageal reflux disease)     History reviewed. No pertinent surgical history.  Prior to Admission medications   Medication Sig Start Date End Date Taking? Authorizing Provider  pantoprazole (PROTONIX) 40 MG tablet Take 1 tablet (40 mg total) by mouth daily. 11/15/20  Yes Burchette, Alinda Sierras, MD    Current Outpatient Medications  Medication Sig Dispense Refill   pantoprazole (PROTONIX) 40 MG tablet Take 1 tablet (40 mg total) by mouth daily. 30 tablet 3   Current Facility-Administered Medications  Medication Dose Route Frequency Provider Last Rate Last Admin   0.9 %  sodium chloride infusion  500 mL Intravenous Once Livana Yerian, Lajuan Lines, MD        Allergies as of 01/05/2021   (No Known Allergies)    Family History  Problem Relation Age of Onset   Hearing loss Mother    Hyperlipidemia Father    Alzheimer's disease Maternal Grandmother    Cancer Maternal Grandfather        type unknown   Breast cancer Paternal Grandmother    Heart disease Paternal Grandfather    Breast  cancer Maternal Aunt    Heart disease Paternal Uncle 75       mi   Heart attack Paternal Uncle    Heart attack Paternal Uncle    Heart attack Paternal Uncle     Social History   Socioeconomic History   Marital status: Single    Spouse name: Not on file   Number of children: 2   Years of education: Not on file   Highest education level: Not on file  Occupational History   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  Tobacco Use   Smoking status: Never   Smokeless tobacco: Never  Vaping Use   Vaping Use: Never used  Substance and Sexual Activity   Alcohol use: Yes    Comment: .25 drinks per day; mostly on weekends   Drug use: No   Sexual activity: Yes  Other Topics Concern   Not on file  Social History Narrative   Not on file   Social Determinants of Health   Financial Resource Strain: Not on file  Food Insecurity: Not on file  Transportation Needs: Not on file  Physical Activity: Not on file  Stress: Not on file  Social Connections: Not on file  Intimate Partner Violence: Not on file    Physical Exam: Vital signs in last 24 hours: '@BP'$  111/61   Pulse 65   Temp 98 F (36.7 C)   Ht '5\' 8"'$  (1.727 m)   Wt 149 lb (67.6 kg)  SpO2 98%   BMI 22.66 kg/m  GEN: NAD EYE: Sclerae anicteric ENT: MMM CV: Non-tachycardic Pulm: CTA b/l GI: Soft, NT/ND NEURO:  Alert & Oriented x 3   Zenovia Jarred, MD [facility]  01/05/2021 1:34 PM

## 2021-01-05 NOTE — Telephone Encounter (Signed)
Called pt to inform him that EGD instructions were sent to him in MyChart and a copy will be sent in the mail. Pt states he is able to access his mychart but does not use it often.

## 2021-01-05 NOTE — Progress Notes (Signed)
Vss nad pt transfeered to recovery

## 2021-01-09 ENCOUNTER — Telehealth: Payer: Self-pay | Admitting: *Deleted

## 2021-01-09 NOTE — Telephone Encounter (Signed)
Attempted f/u phone call. No answer. Left message. °

## 2021-01-09 NOTE — Telephone Encounter (Signed)
No answer for post procedure callback. Unable to leave VM. 

## 2021-01-11 ENCOUNTER — Encounter: Payer: Self-pay | Admitting: Internal Medicine

## 2021-01-27 ENCOUNTER — Encounter: Payer: Self-pay | Admitting: Internal Medicine

## 2021-02-15 ENCOUNTER — Encounter: Payer: Self-pay | Admitting: Internal Medicine

## 2021-02-15 ENCOUNTER — Ambulatory Visit (AMBULATORY_SURGERY_CENTER): Payer: BC Managed Care – PPO | Admitting: Internal Medicine

## 2021-02-15 VITALS — BP 101/68 | HR 69 | Temp 97.7°F | Resp 13 | Ht 68.25 in | Wt 149.4 lb

## 2021-02-15 DIAGNOSIS — R1310 Dysphagia, unspecified: Secondary | ICD-10-CM

## 2021-02-15 DIAGNOSIS — K219 Gastro-esophageal reflux disease without esophagitis: Secondary | ICD-10-CM | POA: Diagnosis not present

## 2021-02-15 DIAGNOSIS — K222 Esophageal obstruction: Secondary | ICD-10-CM | POA: Diagnosis not present

## 2021-02-15 MED ORDER — SODIUM CHLORIDE 0.9 % IV SOLN: 500.0000 mL | Freq: Once | INTRAVENOUS | Status: AC

## 2021-02-15 NOTE — Patient Instructions (Signed)
Resume previous diet and medications.   YOU HAD AN ENDOSCOPIC PROCEDURE TODAY AT [facility]:   Refer to the procedure report that was given to you for any specific questions about what was found during the examination.  If the procedure report does not answer your questions, please call your gastroenterologist to clarify.  If you requested that your care partner not be given the details of your procedure findings, then the procedure report has been included in a sealed envelope for you to review at your convenience later.  YOU SHOULD EXPECT: Some feelings of bloating in the abdomen. Passage of more gas than usual.  Walking can help get rid of the air that was put into your GI tract during the procedure and reduce the bloating. If you had a lower endoscopy (such as a colonoscopy or flexible sigmoidoscopy) you may notice spotting of blood in your stool or on the toilet paper. If you underwent a bowel prep for your procedure, you may not have a normal bowel movement for a few days.  Please Note:  You might notice some irritation and congestion in your nose or some drainage.  This is from the oxygen used during your procedure.  There is no need for concern and it should clear up in a day or so.  SYMPTOMS TO REPORT IMMEDIATELY:   Following upper endoscopy (EGD)  Vomiting of blood or coffee ground material  New chest pain or pain under the shoulder blades  Painful or persistently difficult swallowing  New shortness of breath  Fever of 100F or higher  Black, tarry-looking stools  For urgent or emergent issues, a gastroenterologist can be reached at any hour by calling [phone]. Do not use MyChart messaging for urgent concerns.    DIET:  We do recommend a small meal at first, but then you may proceed to your regular diet.  Drink plenty of fluids but you should avoid alcoholic beverages for 24 hours.  ACTIVITY:  You should plan to take it easy for the rest of today and you  should NOT DRIVE or use heavy machinery until tomorrow (because of the sedation medicines used during the test).    FOLLOW UP: Our staff will call the number listed on your records 48-72 hours following your procedure to check on you and address any questions or concerns that you may have regarding the information given to you following your procedure. If we do not reach you, we will leave a message.  We will attempt to reach you two times.  During this call, we will ask if you have developed any symptoms of COVID 19. If you develop any symptoms (ie: fever, flu-like symptoms, shortness of breath, cough etc.) before then, please call [phone].  If you test positive for Covid 19 in the 2 weeks post procedure, please call and report this information to [place].    If any biopsies were taken you will be contacted by phone or by letter within the next 1-3 weeks.  Please call us at [phone] if you have not heard about the biopsies in 3 weeks.    SIGNATURES/CONFIDENTIALITY: You and/or your care partner have signed paperwork which will be entered into your electronic medical record.  These signatures attest to the fact that that the information above on your After Visit Summary has been reviewed and is understood.  Full responsibility of the confidentiality of this discharge information lies with you and/or your care-partner.

## 2021-02-15 NOTE — Progress Notes (Signed)
Called to room to assist during endoscopic procedure.  Patient ID and intended procedure confirmed with present staff. Received instructions for my participation in the procedure from the performing physician.  

## 2021-02-15 NOTE — Progress Notes (Signed)
Report to PACU, RN, vss, BBS= Clear.  

## 2021-02-15 NOTE — Op Note (Signed)
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 Medicines:                Monitored Anesthesia Care Procedure:                Pre-Anesthesia Assessment:                           - Prior to the procedure, a History and Physical                            was performed, and patient medications and                            allergies were reviewed. The patient's tolerance of                            previous anesthesia was also reviewed. The risks                            and benefits of the procedure and the sedation                            options and risks were discussed with the patient.                            All questions were answered, and informed consent                            was obtained. Prior Anticoagulants: The patient has                            taken no previous anticoagulant or antiplatelet                            agents. ASA Grade Assessment: II - A patient with  mild systemic disease. After reviewing the risks                            and benefits, the patient was deemed in                            satisfactory condition to undergo the procedure.                           After obtaining informed consent, the endoscope was                            passed under direct vision. Throughout the                            procedure, the  patient's blood pressure, pulse, and                            oxygen saturations were monitored continuously. The                            GIF [id number] #[id number] was introduced through the                            mouth, and advanced to the second part of duodenum.                            The upper GI endoscopy was accomplished without                            difficulty. The patient tolerated the procedure                            well. Scope In: Scope Out: Findings:                 Two benign-appearing, intrinsic moderate                            (circumferential scarring or stenosis; an endoscope                            may pass) stenoses were found 39 to 40 cm from the                            incisors. The narrowest stenosis measured 1 cm                            (inner diameter) x 1 cm (in length). The stenoses                            were traversed after which there was mild heme                            (  before balloon dilation). A TTS dilator was passed                            through the scope. Dilation with a 13.5-14.5-15.5                            mm balloon dilator was performed to 14.5 mm. The                            dilation site was examined and showed moderate                            mucosal disruption and moderate improvement in                            luminal narrowing.                           The entire examined stomach was normal.                           The examined duodenum was normal. Complications:            No immediate complications. Estimated Blood Loss:     Estimated blood loss was minimal. Impression:               - Benign-appearing esophageal stenoses. Dilated to                            14.5 mm with balloon.                           - Normal stomach.                           - Normal examined duodenum.                           - No specimens collected. Recommendation:           - Patient has a contact number  available for                            emergencies. The signs and symptoms of potential                            delayed complications were discussed with the                            patient. Return to normal activities tomorrow.                            Written discharge instructions were provided to the                            patient.                           -  Resume previous diet.                           - Continue present medications.                           - Repeat upper endoscopy as needed for retreatment. Jerene Bears, MD 02/15/2021 10:44:49 AM This report has been signed electronically.

## 2021-02-15 NOTE — Progress Notes (Signed)
DT VS 

## 2021-02-15 NOTE — Progress Notes (Signed)
GASTROENTEROLOGY PROCEDURE H&P NOTE   Primary Care Physician: Eulas Post, MD    Reason for Procedure:  Esophageal stricture, GERD with dysphagia  Plan:    EGD with dilation  Patient is appropriate for endoscopic procedure(s) in the ambulatory ([place]) setting.  The nature of the procedure, as well as the risks, benefits, and alternatives were carefully and thoroughly reviewed with the patient. Ample time for discussion and questions allowed. The patient understood, was satisfied, and agreed to proceed.     HPI: Roy King is a 40 y.o. male who presents for repeat EGD.  He was here on 01/05/2021 to evaluate GERD and dysphagia.  He had significant stricture at GE junction dilated with endoscope only.  He reports considerable improvement in swallowing with no recent dysphagia.  He has been adherent with pantoprazole.  No ongoing GERD though he does notice hoarseness.  No chest pain or shortness of breath.  Past Medical History:  Diagnosis Date   Chicken pox    GERD (gastroesophageal reflux disease)     No past surgical history on file.  Prior to Admission medications   Medication Sig Start Date End Date Taking? Authorizing Provider  pantoprazole (PROTONIX) 40 MG tablet Take 1 tablet (40 mg total) by mouth daily. 11/15/20   Burchette, Alinda Sierras, MD    Current Outpatient Medications  Medication Sig Dispense Refill   pantoprazole (PROTONIX) 40 MG tablet Take 1 tablet (40 mg total) by mouth daily. 30 tablet 3   No current facility-administered medications for this visit.    Allergies as of 02/15/2021   (No Known Allergies)    Family History  Problem Relation Age of Onset   Hearing loss Mother    Hyperlipidemia Father    Alzheimer's disease Maternal Grandmother    Cancer Maternal Grandfather        type unknown   Breast cancer Paternal Grandmother    Heart disease Paternal Grandfather    Breast cancer Maternal Aunt    Heart disease Paternal Uncle 62       mi    Heart attack Paternal Uncle    Heart attack Paternal Uncle    Heart attack Paternal Uncle     Social History   Socioeconomic History   Marital status: Single    Spouse name: Not on file   Number of children: 2   Years of education: Not on file   Highest education level: Not on file  Occupational History   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  Tobacco Use   Smoking status: Never   Smokeless tobacco: Never  Vaping Use   Vaping Use: Never used  Substance and Sexual Activity   Alcohol use: Yes    Comment: .25 drinks per day; mostly on weekends   Drug use: No   Sexual activity: Yes  Other Topics Concern   Not on file  Social History Narrative   Not on file   Social Determinants of Health   Financial Resource Strain: Not on file  Food Insecurity: Not on file  Transportation Needs: Not on file  Physical Activity: Not on file  Stress: Not on file  Social Connections: Not on file  Intimate Partner Violence: Not on file    Physical Exam: Vital signs in last 24 hours: @There  were no vitals taken for this visit. GEN: NAD EYE: Sclerae anicteric ENT: MMM CV: Non-tachycardic Pulm: CTA b/l GI: Soft, NT/ND NEURO:  Alert & Oriented x 3   Zenovia Jarred, MD [facility]  02/15/2021 10:01  AM

## 2021-02-17 ENCOUNTER — Telehealth: Payer: Self-pay

## 2021-02-17 NOTE — Telephone Encounter (Signed)
  Follow up Call-  Call back number 02/15/2021 01/05/2021  Post procedure Call Back phone  # [phone] cell [phone]  Permission to leave phone message Yes Yes  Some recent data might be hidden     Patient questions:  Do you have a fever, pain , or abdominal swelling? No. Pain Score  0 *  Have you tolerated food without any problems? Yes.    Have you been able to return to your normal activities? Yes.    Do you have any questions about your discharge instructions: Diet   No. Medications  No. Follow up visit  No.  Do you have questions or concerns about your Care? No.  Actions: * If pain score is 4 or above: No action needed, pain <4.

## 2021-02-17 NOTE — Telephone Encounter (Signed)
Attempted to reach patient for post-procedure f/u call. No answer. Unable to leave message,.

## 2021-04-03 ENCOUNTER — Other Ambulatory Visit: Payer: Self-pay | Admitting: Family Medicine

## 2021-04-13 DIAGNOSIS — Z20822 Contact with and (suspected) exposure to covid-19: Secondary | ICD-10-CM | POA: Diagnosis not present

## 2021-06-25 DIAGNOSIS — Z20822 Contact with and (suspected) exposure to covid-19: Secondary | ICD-10-CM | POA: Diagnosis not present

## 2021-08-15 ENCOUNTER — Other Ambulatory Visit: Payer: Self-pay | Admitting: Family Medicine

## 2021-11-10 ENCOUNTER — Encounter: Payer: Self-pay | Admitting: Family Medicine

## 2021-11-10 ENCOUNTER — Ambulatory Visit (INDEPENDENT_AMBULATORY_CARE_PROVIDER_SITE_OTHER): Payer: BC Managed Care – PPO | Admitting: Family Medicine

## 2021-11-10 VITALS — BP 106/66 | HR 78 | Temp 98.6°F | Ht 68.5 in | Wt 147.7 lb

## 2021-11-10 DIAGNOSIS — Z0000 Encounter for general adult medical examination without abnormal findings: Secondary | ICD-10-CM

## 2021-11-10 DIAGNOSIS — D2122 Benign neoplasm of connective and other soft tissue of left lower limb, including hip: Secondary | ICD-10-CM

## 2021-11-10 NOTE — Progress Notes (Signed)
Established Patient Office Visit  Subjective   Patient ID: Roy King, male    DOB: 1981-03-12  Age: 41 y.o. MRN: [id number]  No chief complaint on file.   HPI   Roy King is seen today for annual physical exam.  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  He brings in a copy of lab work and this looked completely normal except for cholesterol 209, triglycerides 180, HDL 64, LDL 120.  Generally doing well.  He and his wife just had their third child and another son in April.  They now have a 20-year-old, 33-year-old, and 26-monthold.  Not getting much exercise recently.  He has had some reflux symptoms for years.  Developed some dysphagia and last year saw GI.  Had esophageal stricture which was dilated and doing better symptomatically.  Biopsies unremarkable.  Health maintenance reviewed  -Tetanus due 2026 -No specific risk factors for hepatitis C  Family history-mother has decreased hearing.  Father has hyperlipidemia but no CAD.  Paternal grandfather died of CAD in his 617s  He had 3 uncles with MI 1 in his 433s  He has a twin brother and 2 sisters that have no active health problems.  Social history-married with 3 children as above, all sons.  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  Infrequent alcohol use.  Never smoked.  Past Medical History:  Diagnosis Date   Chicken pox    GERD (gastroesophageal reflux disease)    Past Surgical History:  Procedure Laterality Date   UPPER GASTROINTESTINAL ENDOSCOPY     wisdomm teeth extraction     x4 removed    reports that he has never smoked. He has never used smokeless tobacco. He reports current alcohol use. He reports that he does not use drugs. family history includes Alzheimer's disease in his maternal grandmother; Breast cancer in his maternal aunt and paternal grandmother; Cancer in his maternal grandfather; Hearing loss in his mother; Heart attack in his  paternal uncle, paternal uncle, and paternal uncle; Heart disease in his paternal grandfather; Heart disease (age of onset: 468 in his paternal uncle; Hyperlipidemia in his father. No Known Allergies   Review of Systems  Constitutional:  Negative for chills, fever, malaise/fatigue and weight loss.  HENT:  Negative for hearing loss.   Eyes:  Negative for blurred vision and double vision.  Respiratory:  Negative for cough and shortness of breath.   Cardiovascular:  Negative for chest pain, palpitations and leg swelling.  Gastrointestinal:  Negative for abdominal pain, blood in stool, constipation and diarrhea.  Genitourinary:  Negative for dysuria.  Skin:  Negative for rash.  Neurological:  Negative for dizziness, speech change, seizures, loss of consciousness and headaches.  Psychiatric/Behavioral:  Negative for depression.       Objective:     BP 106/66 (BP Location: Left Arm, Patient Position: Sitting, Cuff Size: Normal)   Pulse 78   Temp 98.6 F (37 C) (Oral)   Ht 5' 8.5" (1.74 m)   Wt 147 lb 11.2 oz (67 kg)   SpO2 97%   BMI 22.13 kg/m    Physical Exam Constitutional:      General: He is not in acute distress.    Appearance: He is well-developed.  HENT:     Head: Normocephalic and atraumatic.     Right Ear: External ear normal.     Left Ear: External ear normal.  Eyes:     Conjunctiva/sclera:  Conjunctivae normal.     Pupils: Pupils are equal, round, and reactive to light.  Neck:     Thyroid: No thyromegaly.  Cardiovascular:     Rate and Rhythm: Normal rate and regular rhythm.     Heart sounds: Normal heart sounds. No murmur heard. Pulmonary:     Effort: No respiratory distress.     Breath sounds: No wheezing or rales.  Abdominal:     General: Bowel sounds are normal. There is no distension.     Palpations: Abdomen is soft. There is no mass.     Tenderness: There is no abdominal tenderness. There is no guarding or rebound.  Musculoskeletal:     Cervical back:  Normal range of motion and neck supple.  Lymphadenopathy:     Cervical: No cervical adenopathy.  Skin:    Findings: No rash.     Comments: He has a couple of fibromatous type lesions including them dorsum left great toe and dorsum left foot.  Nontender.  Is been present for several years.  Neurological:     Mental Status: He is alert and oriented to person, place, and time.     Cranial Nerves: No cranial nerve deficit.      No results found for any visits on 11/10/21.    The ASCVD Risk score (Arnett DK, et al., 2019) failed to calculate for the following reasons:   The 2019 ASCVD risk score is only valid for ages 68 to 37    Assessment & Plan:   Physical exam-generally healthy 41 year old male.  No active medical problems.  Mild hyperlipidemia.  -Discussed trying to establish more consistent exercise. -No labs recommended since he had these done recently through work and these were reviewed -Requesting podiatry referral regarding benign appearing fibroma-like lesions left foot  No follow-ups on file.    Carolann Littler, MD

## 2021-11-22 ENCOUNTER — Ambulatory Visit (INDEPENDENT_AMBULATORY_CARE_PROVIDER_SITE_OTHER): Payer: BC Managed Care – PPO

## 2021-11-22 ENCOUNTER — Encounter: Payer: Self-pay | Admitting: Podiatry

## 2021-11-22 ENCOUNTER — Ambulatory Visit (INDEPENDENT_AMBULATORY_CARE_PROVIDER_SITE_OTHER): Payer: BC Managed Care – PPO | Admitting: Podiatry

## 2021-11-22 DIAGNOSIS — B070 Plantar wart: Secondary | ICD-10-CM | POA: Diagnosis not present

## 2021-11-22 DIAGNOSIS — M779 Enthesopathy, unspecified: Secondary | ICD-10-CM

## 2021-11-22 NOTE — Progress Notes (Signed)
Subjective:   Patient ID: Roy King, male   DOB: 41 y.o.   MRN: [id number]   HPI Patient presents with a small nodule on the left inner phalangeal joint big toe and on the metatarsal joint left with both of them appearing to be mostly within skin versus bone but difficult to ascertain.  Patient states its been there for approximately 2 years patient does not smoke likes to be active   Review of Systems  All other systems reviewed and are negative.       Objective:  Physical Exam Vitals and nursing note reviewed.  Constitutional:      Appearance: He is well-developed.  Pulmonary:     Effort: Pulmonary effort is normal.  Musculoskeletal:        General: Normal range of motion.  Skin:    General: Skin is warm.  Neurological:     Mental Status: He is alert.     Neurovascular status intact muscle strength found to be within normal limits range of motion within normal limits.  Patient does have a small mass on the dorsal lateral aspect of the first metatarsal head left measuring about 7 x 7 mm and at the inner phalangeal joint of the left big toe measuring about 5 x 5 mm.  Patient does have a roughened skin structure associated with it and there is good digital perfusion well oriented x3     Assessment:  Appears to be more soft tissue versus bone as far as pathology goes with the possibility that this is viral     Plan:  H&P x-ray reviewed and today I did go ahead and I applied chemical agent to create immune response with sterile dressing and I explained what to do if any blistering were to occur and if it were to partially get better I want to see him back.  If it remains like this I recommended leaving it versus excising these as I think that would be difficult would have recovery and since they are nonpainful I would rather not do that.  Patient is to come back as symptoms indicate  X-rays were negative for signs of bone pressure or bone spur formation in the area of the lesion  formations

## 2022-01-04 ENCOUNTER — Other Ambulatory Visit: Payer: Self-pay | Admitting: Family Medicine

## 2022-02-13 DIAGNOSIS — Z3009 Encounter for other general counseling and advice on contraception: Secondary | ICD-10-CM | POA: Diagnosis not present

## 2022-03-05 ENCOUNTER — Ambulatory Visit (INDEPENDENT_AMBULATORY_CARE_PROVIDER_SITE_OTHER): Payer: BC Managed Care – PPO | Admitting: Podiatry

## 2022-03-05 ENCOUNTER — Encounter: Payer: Self-pay | Admitting: Podiatry

## 2022-03-05 DIAGNOSIS — B070 Plantar wart: Secondary | ICD-10-CM

## 2022-03-05 NOTE — Progress Notes (Signed)
Subjective:   Patient ID: Roy King, male   DOB: 41 y.o.   MRN: [id number]   HPI Patient presents stating not sure if he should have this area taken off that it did not respond well to the medication   ROS      Objective:  Physical Exam  Neurovascular status intact with a small nodule present just lateral to the extensor tendon near the MPJ freely movable that is not currently tender     Assessment:  Probability that this could still be a small verruca versus the possibility that this could be a epidermal type cyst formation     Plan:  Reviewed condition and its its its not painful I do not recommend removal but if it grew in size change colors or other pathology develop it should be removed.  He agrees to this approach does not want to take any other aggressive approach currently to this condition and patient will be seen back as needed

## 2022-05-26 ENCOUNTER — Other Ambulatory Visit: Payer: Self-pay | Admitting: Family Medicine

## 2022-09-07 ENCOUNTER — Ambulatory Visit (INDEPENDENT_AMBULATORY_CARE_PROVIDER_SITE_OTHER): Payer: No Typology Code available for payment source | Admitting: Family Medicine

## 2022-09-07 ENCOUNTER — Encounter: Payer: Self-pay | Admitting: Family Medicine

## 2022-09-07 VITALS — BP 116/78 | HR 62 | Temp 98.0°F | Wt 146.4 lb

## 2022-09-07 DIAGNOSIS — R5383 Other fatigue: Secondary | ICD-10-CM

## 2022-09-07 DIAGNOSIS — J020 Streptococcal pharyngitis: Secondary | ICD-10-CM

## 2022-09-07 DIAGNOSIS — Z20818 Contact with and (suspected) exposure to other bacterial communicable diseases: Secondary | ICD-10-CM

## 2022-09-07 DIAGNOSIS — R52 Pain, unspecified: Secondary | ICD-10-CM | POA: Diagnosis not present

## 2022-09-07 LAB — POCT RAPID STREP A (OFFICE): Rapid Strep A Screen: NEGATIVE

## 2022-09-07 MED ORDER — AMOXICILLIN-POT CLAVULANATE 500-125 MG PO TABS: 1.0000 | ORAL_TABLET | Freq: Two times a day (BID) | ORAL | 0 refills | Status: AC

## 2022-09-07 NOTE — Progress Notes (Signed)
Established Patient Office Visit   Subjective  Patient ID: Roy King, male    DOB: 1981-02-04  Age: 42 y.o. MRN: [id number]  Chief Complaint  Patient presents with   Sore Throat    And body aches for 10 days. Has 3 little ones and they have been bringing home some germs. SOB as well, does have some brown mucus. Feels like  a ball in his throat. All over body aches more than normal.    Pt is a 42 yo male with pmh sig for dysphagia followed by Dr. Caryl Never and seen for acute concern.  Pt with ST x 1 wk.  Feels like something is stuck in throat when swallowing.  Started developing body aches, fatigue, and HA in the last few days. Denies fever, chills, ear pain/pressure, facial pain/pressure, rhinorrhea, nausea, vomiting.  Pt's youngest son on abx for strep throat.  Sore Throat       ROS Negative unless stated above    Objective:     BP 116/78 (BP Location: Right Arm, Patient Position: Sitting, Cuff Size: Normal)   Pulse 62   Temp 98 F (36.7 C) (Oral)   Wt 146 lb 6.4 oz (66.4 kg)   SpO2 98%   BMI 21.93 kg/m    Physical Exam Constitutional:      General: He is not in acute distress.    Appearance: Normal appearance.  HENT:     Head: Normocephalic and atraumatic.     Right Ear: Tympanic membrane normal.     Left Ear: Tympanic membrane normal.     Nose: Nose normal.     Mouth/Throat:     Mouth: Mucous membranes are moist.     Pharynx: Posterior oropharyngeal erythema present.     Tonsils: Tonsillar exudate present. 2+ on the right. 2+ on the left.  Eyes:     Pupils: Pupils are equal, round, and reactive to light.  Cardiovascular:     Rate and Rhythm: Normal rate and regular rhythm.     Heart sounds: Normal heart sounds. No murmur heard.    No gallop.  Pulmonary:     Effort: Pulmonary effort is normal. No respiratory distress.     Breath sounds: Normal breath sounds. No wheezing, rhonchi or rales.  Lymphadenopathy:     Cervical: Cervical adenopathy present.   Skin:    General: Skin is warm and dry.  Neurological:     Mental Status: He is alert and oriented to person, place, and time.      Results for orders placed or performed in visit on 09/07/22  POC Rapid Strep A  Result Value Ref Range   Rapid Strep A Screen Negative Negative      Assessment & Plan:  Strep pharyngitis -new problem -Presumed strep infection given exposure and appearance of tonsils on exam despite negative testing. -     Amoxicillin-Pot Clavulanate; Take 1 tablet by mouth in the morning and at bedtime for 10 days.  Dispense: 20 tablet; Refill: 0  Strep throat exposure -     POCT rapid strep A  Fatigue, unspecified type -     POCT rapid strep A  Body aches -     POCT rapid strep A  Strep test negative, however start abx due to exposure and appearance of tonsils on exam.  Supportive care including gargling with warm salt water or chloraseptic spray, tylenol or NSAIDs, warm fluids, rest, etc.    Return if symptoms worsen or fail to improve.  Deeann Saint, MD

## 2022-10-06 ENCOUNTER — Other Ambulatory Visit: Payer: Self-pay | Admitting: Family Medicine

## 2022-11-13 ENCOUNTER — Ambulatory Visit (INDEPENDENT_AMBULATORY_CARE_PROVIDER_SITE_OTHER): Payer: No Typology Code available for payment source | Admitting: Family Medicine

## 2022-11-13 VITALS — BP 100/60 | HR 69 | Temp 97.4°F | Ht 68.11 in | Wt 148.3 lb

## 2022-11-13 DIAGNOSIS — Z0000 Encounter for general adult medical examination without abnormal findings: Secondary | ICD-10-CM | POA: Diagnosis not present

## 2022-11-13 DIAGNOSIS — Z8249 Family history of ischemic heart disease and other diseases of the circulatory system: Secondary | ICD-10-CM

## 2022-11-13 LAB — COMPREHENSIVE METABOLIC PANEL
ALT: 12 U/L (ref 0–53)
AST: 18 U/L (ref 0–37)
Albumin: 4.4 g/dL (ref 3.5–5.2)
Alkaline Phosphatase: 39 U/L (ref 39–117)
BUN: 19 mg/dL (ref 6–23)
CO2: 29 mEq/L (ref 19–32)
Calcium: 9.2 mg/dL (ref 8.4–10.5)
Chloride: 102 mEq/L (ref 96–112)
Creatinine, Ser: 0.91 mg/dL (ref 0.40–1.50)
GFR: 105.18 mL/min (ref >60.00)
Glucose, Bld: 83 mg/dL (ref 70–99)
Potassium: 3.8 mEq/L (ref 3.5–5.1)
Sodium: 137 mEq/L (ref 135–145)
Total Bilirubin: 0.4 mg/dL (ref 0.2–1.2)
Total Protein: 7.1 g/dL (ref 6.0–8.3)

## 2022-11-13 LAB — CBC WITH DIFFERENTIAL/PLATELET
Basophils Absolute: 0.1 10*3/uL (ref 0.0–0.1)
Basophils Relative: 1 % (ref 0.0–3.0)
Eosinophils Absolute: 0.4 10*3/uL (ref 0.0–0.7)
Eosinophils Relative: 7.2 % — ABNORMAL HIGH (ref 0.0–5.0)
HCT: 40.6 % (ref 39.0–52.0)
Hemoglobin: 13.5 g/dL (ref 13.0–17.0)
Lymphocytes Relative: 32.7 % (ref 12.0–46.0)
Lymphs Abs: 1.9 10*3/uL (ref 0.7–4.0)
MCHC: 33.3 g/dL (ref 30.0–36.0)
MCV: 90.5 fl (ref 78.0–100.0)
Monocytes Absolute: 0.4 10*3/uL (ref 0.1–1.0)
Monocytes Relative: 7.3 % (ref 3.0–12.0)
Neutro Abs: 3 10*3/uL (ref 1.4–7.7)
Neutrophils Relative %: 51.8 % (ref 43.0–77.0)
Platelets: 184 10*3/uL (ref 150.0–400.0)
RBC: 4.49 Mil/uL (ref 4.22–5.81)
RDW: 13 % (ref 11.5–15.5)
WBC: 5.7 10*3/uL (ref 4.0–10.5)

## 2022-11-13 LAB — LIPID PANEL
Cholesterol: 218 mg/dL — ABNORMAL HIGH (ref 0–200)
HDL: 50.9 mg/dL (ref >39.00)
LDL Cholesterol: 142 mg/dL — ABNORMAL HIGH (ref 0–99)
NonHDL: 166.67
Total CHOL/HDL Ratio: 4
Triglycerides: 124 mg/dL (ref 0.0–149.0)
VLDL: 24.8 mg/dL (ref 0.0–40.0)

## 2022-11-13 LAB — TSH: TSH: 1.51 u[IU]/mL (ref 0.35–5.50)

## 2022-11-13 NOTE — Progress Notes (Signed)
Established Patient Office Visit  Subjective   Patient ID: Roy King, male    DOB: [birth year]  Age: 42 y.o. MRN: [id number]  Chief Complaint  Patient presents with   Annual Exam    HPI   Sherwin is seen for physical exam.  He has history of GERD with esophageal stricture which was dilated couple times previously.  Denies any current dysphagia.  Takes pantoprazole 40 mg daily.  No active reflux symptoms.  Generally doing well.  Staying very busy with work and has 3 children ranging in age from 1-1/2 to 6 and half.  Not getting consistent exercise.  Health maintenance reviewed  -No history of hepatitis C screening but low risk and patient declines today. -Tetanus due 2026 -Gets yearly flu vaccines in the fall  Family history-mother has chronic hearing loss but otherwise healthy.  Father with history of hyperlipidemia.  Paternal grandfather died in his 56s of CAD.  He had 3 uncles with MI in their 80s including one that died age 47 of MI.  He has twin brother and 2 sisters with no active health problems  Social history-married with 3 children as above.  Works in Scientific laboratory technician.  Rare alcohol use.  No history of smoking.  Past Medical History:  Diagnosis Date   Chicken pox    GERD (gastroesophageal reflux disease)    Past Surgical History:  Procedure Laterality Date   UPPER GASTROINTESTINAL ENDOSCOPY     wisdomm teeth extraction     x4 removed    reports that he has never smoked. He has never used smokeless tobacco. He reports current alcohol use. He reports that he does not use drugs. family history includes Alzheimer's disease in his maternal grandmother; Breast cancer in his maternal aunt and paternal grandmother; Cancer in his maternal grandfather; Hearing loss in his mother; Heart attack in his paternal uncle, paternal uncle, and paternal uncle; Heart disease in his paternal grandfather; Heart disease (age of onset: 69) in his paternal uncle; Hyperlipidemia in his  father. No Known Allergies   Review of Systems  Constitutional:  Negative for chills, fever, malaise/fatigue and weight loss.  HENT:  Negative for hearing loss.   Eyes:  Negative for blurred vision and double vision.  Respiratory:  Negative for cough and shortness of breath.   Cardiovascular:  Negative for chest pain, palpitations and leg swelling.  Gastrointestinal:  Negative for abdominal pain, blood in stool, constipation and diarrhea.  Genitourinary:  Negative for dysuria.  Skin:  Negative for rash.  Neurological:  Negative for dizziness, speech change, seizures, loss of consciousness and headaches.  Psychiatric/Behavioral:  Negative for depression.       Objective:     BP 100/60 (BP Location: Left Arm, Patient Position: Sitting, Cuff Size: Normal)   Pulse 69   Temp (!) 97.4 F (36.3 C) (Oral)   Ht 5' 8.11" (1.73 m)   Wt 148 lb 4.8 oz (67.3 kg)   SpO2 98%   BMI 22.48 kg/m  BP Readings from Last 3 Encounters:  11/13/22 100/60  09/07/22 116/78  11/10/21 106/66   Wt Readings from Last 3 Encounters:  11/13/22 148 lb 4.8 oz (67.3 kg)  09/07/22 146 lb 6.4 oz (66.4 kg)  11/10/21 147 lb 11.2 oz (67 kg)      Physical Exam Vitals reviewed.  Constitutional:      General: He is not in acute distress.    Appearance: Normal appearance. He is well-developed. He is not ill-appearing.  HENT:  Head: Normocephalic and atraumatic.     Ears:     Comments: Does have some cerumen right canal Eyes:     Conjunctiva/sclera: Conjunctivae normal.     Pupils: Pupils are equal, round, and reactive to light.  Neck:     Thyroid: No thyromegaly.  Cardiovascular:     Rate and Rhythm: Normal rate and regular rhythm.     Heart sounds: Normal heart sounds. No murmur heard. Pulmonary:     Effort: No respiratory distress.     Breath sounds: No wheezing or rales.  Musculoskeletal:     Cervical back: Normal range of motion and neck supple.     Right lower leg: No edema.     Left lower  leg: No edema.  Lymphadenopathy:     Cervical: No cervical adenopathy.  Skin:    Comments: Approximately 3 x 3 cm area of erythema left forearm from recent wasp sting  Neurological:     Mental Status: He is alert and oriented to person, place, and time.     Cranial Nerves: No cranial nerve deficit.      No results found for any visits on 11/13/22.  Last CBC Lab Results  Component Value Date   WBC 5.4 06/02/2020   HGB 14.6 06/02/2020   HCT 43 06/02/2020   MCV 89.4 07/13/2019   RDW 12.8 07/13/2019   PLT 195 06/02/2020   Last metabolic panel Lab Results  Component Value Date   GLUCOSE 85 07/13/2019   NA 138 06/02/2020   K 4.0 06/02/2020   CL 100 06/02/2020   CO2 31 07/13/2019   BUN 14 06/02/2020   CREATININE 1.0 06/02/2020   GFR 103.97 07/13/2019   CALCIUM 9.3 06/02/2020   PROT 6.9 07/13/2019   ALBUMIN 4.8 06/02/2020   BILITOT 0.6 07/13/2019   ALKPHOS 59 06/02/2020   AST 12 (A) 06/02/2020   ALT 11 06/02/2020   Last lipids Lab Results  Component Value Date   CHOL 215 (A) 06/02/2020   HDL 49 06/02/2020   LDLCALC 117 06/02/2020   TRIG 186 (A) 06/02/2020   CHOLHDL 4 07/13/2019   Last thyroid functions Lab Results  Component Value Date   TSH 2.59 06/02/2020      The 10-year ASCVD risk score (Arnett DK, et al., 2019) is: 0.9%* (Cholesterol units were assumed)    Assessment & Plan:   Problem List Items Addressed This Visit   None Visit Diagnoses     Physical exam    -  Primary   Relevant Orders   Lipid panel   CBC with Differential/Platelet   CMP   TSH     Healthy 42 year old male.  Does have very strong family history of premature CAD with 3 paternal uncles with coronary disease in their 15s and paternal grandfather with coronary disease early 56s.  -Obtain labs as above -Continue annual flu vaccine -Will need tetanus booster in 2 years -We discussed getting coronary calcium score to further risk stratify and he would like to proceed with  that-particularly in view of his previous elevated LDL cholesterol around 133.  He would like to be proactive in terms of possible statin use if he has elevated score. -Continue low saturated fat diet  No follow-ups on file.    Evelena Peat, MD

## 2022-12-10 ENCOUNTER — Ambulatory Visit (HOSPITAL_BASED_OUTPATIENT_CLINIC_OR_DEPARTMENT_OTHER)
Admission: RE | Admit: 2022-12-10 | Discharge: 2022-12-10 | Disposition: A | Discharge status: Home / Self Care | Payer: No Typology Code available for payment source | Source: Ambulatory Visit | Attending: Family Medicine | Admitting: Family Medicine

## 2022-12-10 DIAGNOSIS — Z8249 Family history of ischemic heart disease and other diseases of the circulatory system: Secondary | ICD-10-CM | POA: Insufficient documentation

## 2023-02-19 ENCOUNTER — Other Ambulatory Visit: Payer: Self-pay | Admitting: Family Medicine

## 2023-05-30 ENCOUNTER — Ambulatory Visit: Payer: No Typology Code available for payment source | Admitting: Adult Health

## 2023-05-30 ENCOUNTER — Ambulatory Visit: Payer: Self-pay | Admitting: Family Medicine

## 2023-05-30 VITALS — BP 110/70 | HR 110 | Temp 101.7°F | Ht 68.0 in | Wt 149.0 lb

## 2023-05-30 DIAGNOSIS — J020 Streptococcal pharyngitis: Secondary | ICD-10-CM

## 2023-05-30 DIAGNOSIS — R6889 Other general symptoms and signs: Secondary | ICD-10-CM | POA: Diagnosis not present

## 2023-05-30 LAB — POCT RAPID STREP A (OFFICE): Rapid Strep A Screen: POSITIVE — AB

## 2023-05-30 LAB — POCT INFLUENZA A/B
Influenza A, POC: NEGATIVE
Influenza B, POC: NEGATIVE

## 2023-05-30 LAB — POC COVID19 BINAXNOW: SARS Coronavirus 2 Ag: NEGATIVE

## 2023-05-30 MED ORDER — AMOXICILLIN-POT CLAVULANATE 875-125 MG PO TABS
1.0000 | ORAL_TABLET | Freq: Two times a day (BID) | ORAL | 0 refills | Status: DC
Start: 2023-05-30 — End: 2023-12-03

## 2023-05-30 NOTE — Progress Notes (Signed)
 Subjective:    Patient ID: Roy King, male    DOB: 1981/04/05, 43 y.o.   MRN: [id number]  HPI  43 year old male who  has a past medical history of Chicken pox and GERD (gastroesophageal reflux disease).  He presents to the office today an acute issue. His symptoms started about one day ago. Symptoms include that of headache, body aches, chills, fevers, shortness of breath, sore throat and productive cough. Multiple family members in his home are sick   At home he has been using Dayquil but is unsure if it is helping   Review of Systems See HPI   Past Medical History:  Diagnosis Date   Chicken pox    GERD (gastroesophageal reflux disease)     Social History   Socioeconomic History   Marital status: Single    Spouse name: Not on file   Number of children: 2   Years of education: Not on file   Highest education level: Not on file  Occupational History   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  Tobacco Use   Smoking status: Never   Smokeless tobacco: Never  Vaping Use   Vaping status: Never Used  Substance and Sexual Activity   Alcohol use: Yes    Comment: .25 drinks per day; mostly on weekends   Drug use: No   Sexual activity: Yes  Other Topics Concern   Not on file  Social History Narrative   Not on file   Social Drivers of Health   Financial Resource Strain: Not on file  Food Insecurity: Not on file  Transportation Needs: Not on file  Physical Activity: Not on file  Stress: Not on file (02/27/2023)  Social Connections: Unknown (09/05/2021)   Received from [facility], [facility]   Social Network    Social Network: Not on file  Intimate Partner Violence: Unknown (07/28/2021)   Received from [employer], [facility]   HITS    Physically Hurt: Not on file    Insult or Talk Down To: Not on file    Threaten Physical Harm: Not on file    Scream or Curse: Not on file    Past Surgical History:  Procedure Laterality Date   UPPER GASTROINTESTINAL  ENDOSCOPY     wisdomm teeth extraction     x4 removed    Family History  Problem Relation Age of Onset   Hearing loss Mother    Hyperlipidemia Father    Breast cancer Maternal Aunt    Heart disease Paternal Uncle 34       mi   Heart attack Paternal Uncle    Heart attack Paternal Uncle    Heart attack Paternal Uncle    Alzheimer's disease Maternal Grandmother    Cancer Maternal Grandfather        type unknown   Breast cancer Paternal Grandmother    Heart disease Paternal Grandfather    Colon cancer Neg Hx    Esophageal cancer Neg Hx    Rectal cancer Neg Hx    Stomach cancer Neg Hx     No Known Allergies  Current Outpatient Medications on File Prior to Visit  Medication Sig Dispense Refill   pantoprazole  (PROTONIX ) 40 MG tablet TAKE 1 TABLET BY MOUTH DAILY 30 tablet 5   Current Facility-Administered Medications on File Prior to Visit  Medication Dose Route Frequency Provider Last Rate Last Admin   0.9 %  sodium chloride  infusion  500 mL Intravenous Once Pyrtle, Gordy HERO, MD  BP 110/70   Pulse (!) 110   Temp (!) 101.7 F (38.7 C) (Oral)   Ht 5' 8 (1.727 m)   Wt 149 lb (67.6 kg)   SpO2 96%   BMI 22.66 kg/m       Objective:   Physical Exam Vitals and nursing note reviewed.  Constitutional:      Appearance: Normal appearance. He is ill-appearing.  Cardiovascular:     Rate and Rhythm: Regular rhythm.     Pulses: Normal pulses.     Heart sounds: Normal heart sounds.  Pulmonary:     Effort: Pulmonary effort is normal.     Breath sounds: Normal breath sounds.  Musculoskeletal:        General: Normal range of motion.  Skin:    General: Skin is warm and dry.  Neurological:     General: No focal deficit present.     Mental Status: He is alert and oriented to person, place, and time.  Psychiatric:        Mood and Affect: Mood normal.        Behavior: Behavior normal.        Thought Content: Thought content normal.        Judgment: Judgment normal.         Assessment & Plan:  1. Flu-like symptoms (Primary)  - POC COVID-19 BinaxNow- Negative  - POCT Influenza A/B - Negative - POCT rapid strep A - Positive   2. Strep throat  - Will treat with Augmentin . He can use Tylenol  or Motrin. Advised to stay hydrated, rest and get the rest of his family tested.  - amoxicillin -clavulanate (AUGMENTIN ) 875-125 MG tablet; Take 1 tablet by mouth 2 (two) times daily.  Dispense: 20 tablet; Refill: 0  Darleene Shape, NP

## 2023-05-30 NOTE — Telephone Encounter (Signed)
   Chief Complaint: cold like symptoms Symptoms: cough, yellow sputum, congestion, sore throat, chills, everyone in household is ill Frequency: started today Pertinent Negatives: Patient denies fever, sob,  Disposition: [] ED /[] Urgent Care (no appt availability in office) / [x] Appointment(In office/virtual)/ []  [facility]/ [] Home Care/ [] Refused Recommended Disposition /[] [facility]/ []  Follow-up with PCP Additional Notes: States everyone in home is ill and would like to be tested for the flu.  Apt made for this afternoon.  Care advice given, denies questions. Reason for Disposition  [1] Sinus congestion (pressure, fullness) AND [2] present > 10 days  Answer Assessment - Initial Assessment Questions 1. ONSET: When did the nasal discharge start?      yesterday 2. AMOUNT: How much discharge is there?      Light yellow 3. COUGH: Do you have a cough? If Yes, ask: Describe the color of your sputum (clear, white, yellow, green)     Yes, yellow 4. RESPIRATORY DISTRESS: Describe your breathing.      denies 5. FEVER: Do you have a fever? If Yes, ask: What is your temperature, how was it measured, and when did it start?     chills 6. SEVERITY: Overall, how bad are you feeling right now? (e.g., doesn't interfere with normal activities, staying home from school/work, staying in bed)      Home from work 7. OTHER SYMPTOMS: Do you have any other symptoms? (e.g., sore throat, earache, wheezing, vomiting)     Body aches, throat is sore.  8. PREGNANCY: Is there any chance you are pregnant? When was your last menstrual period?     na  Protocols used: Common Cold-A-AH

## 2023-05-30 NOTE — Telephone Encounter (Signed)
 Noted.  Kristian Covey MD [facility] at [facility]

## 2023-09-22 ENCOUNTER — Other Ambulatory Visit: Payer: Self-pay | Admitting: Family Medicine

## 2023-11-12 ENCOUNTER — Telehealth: Payer: Self-pay

## 2023-11-12 DIAGNOSIS — Z0000 Encounter for general adult medical examination without abnormal findings: Secondary | ICD-10-CM

## 2023-11-12 NOTE — Addendum Note (Signed)
 Addended by: METTA KRISTEN CROME on: 11/12/2023 01:46 PM   Modules accepted: Orders

## 2023-11-12 NOTE — Telephone Encounter (Signed)
 Appt scheduled for lab and CPE. Labs placed

## 2023-11-12 NOTE — Telephone Encounter (Signed)
 Copied from CRM [id number]. Topic: Clinical - Request for Lab/Test Order >> Nov 12, 2023 10:34 AM Harlene ORN wrote: Reason for CRM: Patient called requesting an appointment to be seen for a lab appointment. Would like to be seen on August 1st. Please call back the patient.

## 2023-11-22 ENCOUNTER — Other Ambulatory Visit (INDEPENDENT_AMBULATORY_CARE_PROVIDER_SITE_OTHER)

## 2023-11-22 DIAGNOSIS — Z0000 Encounter for general adult medical examination without abnormal findings: Secondary | ICD-10-CM

## 2023-11-23 LAB — CMP12+LP+TP+TSH+5AC+CBC/D/P...
ALT: 10 IU/L (ref 0–44)
AST: 16 IU/L (ref 0–40)
Albumin: 4.6 g/dL (ref 4.1–5.1)
Alkaline Phosphatase: 53 IU/L (ref 44–121)
BUN/Creatinine Ratio: 21 — ABNORMAL HIGH (ref 9–20)
BUN: 17 mg/dL (ref 6–24)
Basophils Absolute: 0 x10E3/uL (ref 0.0–0.2)
Basos: 1 %
Bilirubin Total: 0.4 mg/dL (ref 0.0–1.2)
Calcium: 9.2 mg/dL (ref 8.7–10.2)
Chloride: 102 mmol/L (ref 96–106)
Chol/HDL Ratio: 3.1 ratio (ref 0.0–5.0)
Cholesterol, Total: 167 mg/dL (ref 100–199)
Creatinine, Ser: 0.82 mg/dL (ref 0.76–1.27)
EOS (ABSOLUTE): 0.2 x10E3/uL (ref 0.0–0.4)
Eos: 4 %
Estimated CHD Risk: <0.5 times avg. (ref 0.0–1.0)
Free Thyroxine Index: 2 (ref 1.2–4.9)
GGT: 10 IU/L (ref 0–65)
Globulin, Total: 2.3 g/dL (ref 1.5–4.5)
Glucose: 88 mg/dL (ref 70–99)
HDL: 54 mg/dL (ref >39)
Hematocrit: 42.4 % (ref 37.5–51.0)
Hemoglobin: 13.6 g/dL (ref 13.0–17.7)
Immature Grans (Abs): 0 x10E3/uL (ref 0.0–0.1)
Immature Granulocytes: 0 %
LDH: 109 IU/L — ABNORMAL LOW (ref 121–224)
LDL Chol Calc (NIH): 93 mg/dL (ref 0–99)
LDL/HDL Ratio: 1.7 ratio (ref 0.0–3.6)
Lymphocytes Absolute: 1.5 x10E3/uL (ref 0.7–3.1)
Lymphs: 36 %
MCH: 30.2 pg (ref 26.6–33.0)
MCHC: 32.1 g/dL (ref 31.5–35.7)
MCV: 94 fL (ref 79–97)
Monocytes Absolute: 0.3 x10E3/uL (ref 0.1–0.9)
Monocytes: 8 %
Neutrophils Absolute: 2.2 x10E3/uL (ref 1.4–7.0)
Neutrophils: 51 %
Phosphorus: 3.3 mg/dL (ref 2.8–4.1)
Platelets: 184 x10E3/uL (ref 150–450)
Potassium: 4.5 mmol/L (ref 3.5–5.2)
RBC: 4.51 x10E6/uL (ref 4.14–5.80)
RDW: 13 % (ref 11.6–15.4)
Sodium: 137 mmol/L (ref 134–144)
T3 Uptake Ratio: 27 % (ref 24–39)
T4, Total: 7.4 ug/dL (ref 4.5–12.0)
TSH: 1.23 u[IU]/mL (ref 0.450–4.500)
Total Protein: 6.9 g/dL (ref 6.0–8.5)
Triglycerides: 113 mg/dL (ref 0–149)
Uric Acid: 4.9 mg/dL (ref 3.8–8.4)
VLDL Cholesterol Cal: 20 mg/dL (ref 5–40)
WBC: 4.2 x10E3/uL (ref 3.4–10.8)
eGFR: 113 mL/min/1.73 (ref >59)

## 2023-11-29 ENCOUNTER — Encounter: Admitting: Family Medicine

## 2023-12-03 ENCOUNTER — Ambulatory Visit (INDEPENDENT_AMBULATORY_CARE_PROVIDER_SITE_OTHER): Admitting: Family Medicine

## 2023-12-03 ENCOUNTER — Encounter: Payer: Self-pay | Admitting: Family Medicine

## 2023-12-03 VITALS — BP 108/64 | HR 77 | Temp 97.5°F | Ht 67.32 in | Wt 150.3 lb

## 2023-12-03 DIAGNOSIS — Z0000 Encounter for general adult medical examination without abnormal findings: Secondary | ICD-10-CM | POA: Diagnosis not present

## 2023-12-03 NOTE — Progress Notes (Signed)
 Established Patient Office Visit  Subjective   Patient ID: Roy King, male    DOB: 1980-09-09  Age: 43 y.o. MRN: [id number]  Chief Complaint  Patient presents with   Annual Exam    HPI   Roy King is seen for annual physical exam.  Generally very healthy.  He has history of GERD which is controlled with Protonix .  Occasional skips doses but no recent breakthrough symptoms.  Has been trying to manage his diet better this past year.  Trying to we have in more exercise which is challenging with 3 children and full-time job.  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  Health maintenance reviewed:  Health Maintenance  Topic Date Due   Hepatitis B Vaccines (1 of 3 - 19+ 3-dose series) Never done   HPV VACCINES (1 - 3-dose SCDM series) Never done   COVID-19 Vaccine (1 - 2024-25 season) Never done   INFLUENZA VACCINE  11/22/2023   DTaP/Tdap/Td (2 - Td or Tdap) 04/04/2025   HIV Screening  Completed   Meningococcal B Vaccine  Aged Out   Hepatitis C Screening  Discontinued   Family history reviewed.  Mom with chronic hearing loss but no other health problems.  Father with history of hyperlipidemia but no history of known CAD.  Did have a paternal grandfather with CAD in his 34s and apparently died in his 23s of MI and also 3 uncles with MI in their 4s.  He has twin brother and 2 sisters with no active medical problems  Social history-married with 3 children.  Rare alcohol.  Non-smoker.  Works in [employer] laboratory technician.  Past Medical History:  Diagnosis Date   Chicken pox    GERD (gastroesophageal reflux disease)    Past Surgical History:  Procedure Laterality Date   UPPER GASTROINTESTINAL ENDOSCOPY     wisdomm teeth extraction     x4 removed    reports that he has never smoked. He has never used smokeless tobacco. He reports current alcohol use. He reports that he does not use drugs. family history includes Alzheimer's disease in his maternal grandmother;  Breast cancer in his maternal aunt and paternal grandmother; Cancer in his maternal grandfather; Hearing loss in his mother; Heart attack in his paternal uncle, paternal uncle, and paternal uncle; Heart disease in his paternal grandfather; Heart disease (age of onset: 65) in his paternal uncle; Hyperlipidemia in his father. No Known Allergies  Review of Systems  Constitutional:  Negative for chills, fever and malaise/fatigue.  Eyes:  Negative for blurred vision.  Respiratory:  Negative for shortness of breath.   Cardiovascular:  Negative for chest pain.  Gastrointestinal:  Negative for abdominal pain.  Genitourinary:  Negative for dysuria.  Neurological:  Negative for dizziness, weakness and headaches.      Objective:     BP 108/64   Pulse 77   Temp (!) 97.5 F (36.4 C) (Oral)   Ht 5' 7.32 (1.71 m)   Wt 150 lb 4.8 oz (68.2 kg)   SpO2 95%   BMI 23.31 kg/m  BP Readings from Last 3 Encounters:  12/03/23 108/64  05/30/23 110/70  11/13/22 100/60   Wt Readings from Last 3 Encounters:  12/03/23 150 lb 4.8 oz (68.2 kg)  05/30/23 149 lb (67.6 kg)  11/13/22 148 lb 4.8 oz (67.3 kg)      Physical Exam Vitals reviewed.  Constitutional:      General: He is not in acute distress.    Appearance: He is  well-developed. He is not ill-appearing.  HENT:     Head: Normocephalic and atraumatic.     Right Ear: External ear normal.     Left Ear: External ear normal.  Eyes:     Conjunctiva/sclera: Conjunctivae normal.     Pupils: Pupils are equal, round, and reactive to light.  Neck:     Thyroid : No thyromegaly.  Cardiovascular:     Rate and Rhythm: Normal rate and regular rhythm.     Heart sounds: Normal heart sounds. No murmur heard. Pulmonary:     Effort: No respiratory distress.     Breath sounds: No wheezing or rales.  Abdominal:     General: Bowel sounds are normal. There is no distension.     Palpations: Abdomen is soft. There is no mass.     Tenderness: There is no  abdominal tenderness. There is no guarding or rebound.  Musculoskeletal:     Cervical back: Normal range of motion and neck supple.     Right lower leg: No edema.     Left lower leg: No edema.  Lymphadenopathy:     Cervical: No cervical adenopathy.  Skin:    Findings: No rash.  Neurological:     Mental Status: He is alert and oriented to person, place, and time.     Cranial Nerves: No cranial nerve deficit.      No results found for any visits on 12/03/23.    The 10-year ASCVD risk score (Arnett DK, et al., 2019) is: 0.6%    Assessment & Plan:   Physical exam.  Recent labs reviewed.  He had labs prior to visit today with no major concerns.  Total cholesterol 167, triglycerides 113, HDL 54, LDL 93.  Coronary calcium score previously of 0.  All other labs normal.  - Consider annual flu vaccine -Consider colon cancer screening by age 91 -Will need tetanus booster next year -Continue regular exercise habits No follow-ups on file.    Wolm Scarlet, MD

## 2024-04-02 ENCOUNTER — Other Ambulatory Visit: Payer: Self-pay | Admitting: Family Medicine
